# Patient Record
Sex: Female | Born: 1990 | Race: Black or African American | Hispanic: No | Marital: Single | Smoking: Never smoker
Health system: Southern US, Community
[De-identification: ages and names within clinical notes are randomized; demographics above are authoritative.]

## PROBLEM LIST (undated history)

## (undated) ENCOUNTER — Inpatient Hospital Stay (HOSPITAL_COMMUNITY): Payer: Self-pay

## (undated) DIAGNOSIS — Z789 Other specified health status: Secondary | ICD-10-CM

## (undated) HISTORY — PX: NO PAST SURGERIES: SHX2092

---
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
## 2010-07-20 IMAGING — CR DG TOE 4TH 2+V*L*
3 series · 3 of 3 positions shown · non-contrast
Comparison: None

CLINICAL DATA: Dislocated fourth toe on 12/16/2008 with continued
pain

LEFT TOE - 2+ VIEW

[t toes ap left]
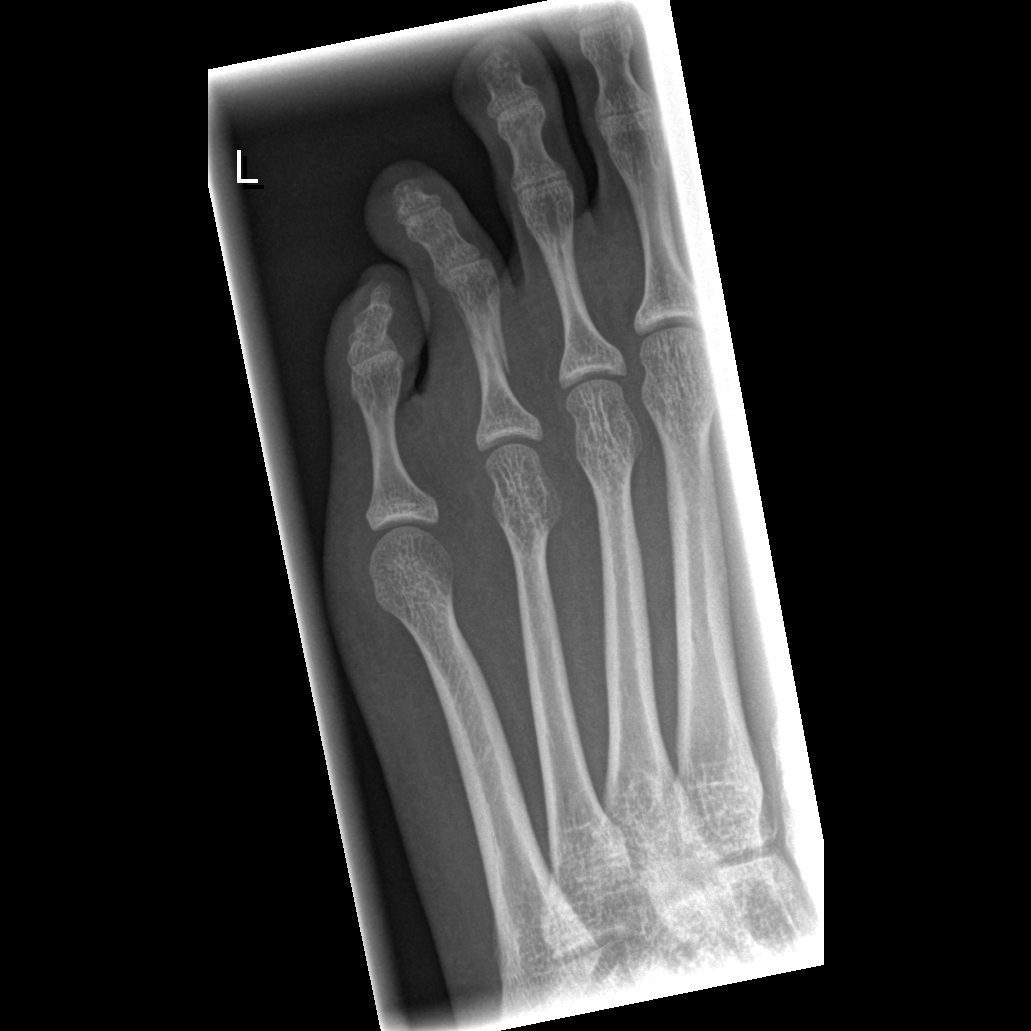

[t toes oblique left]
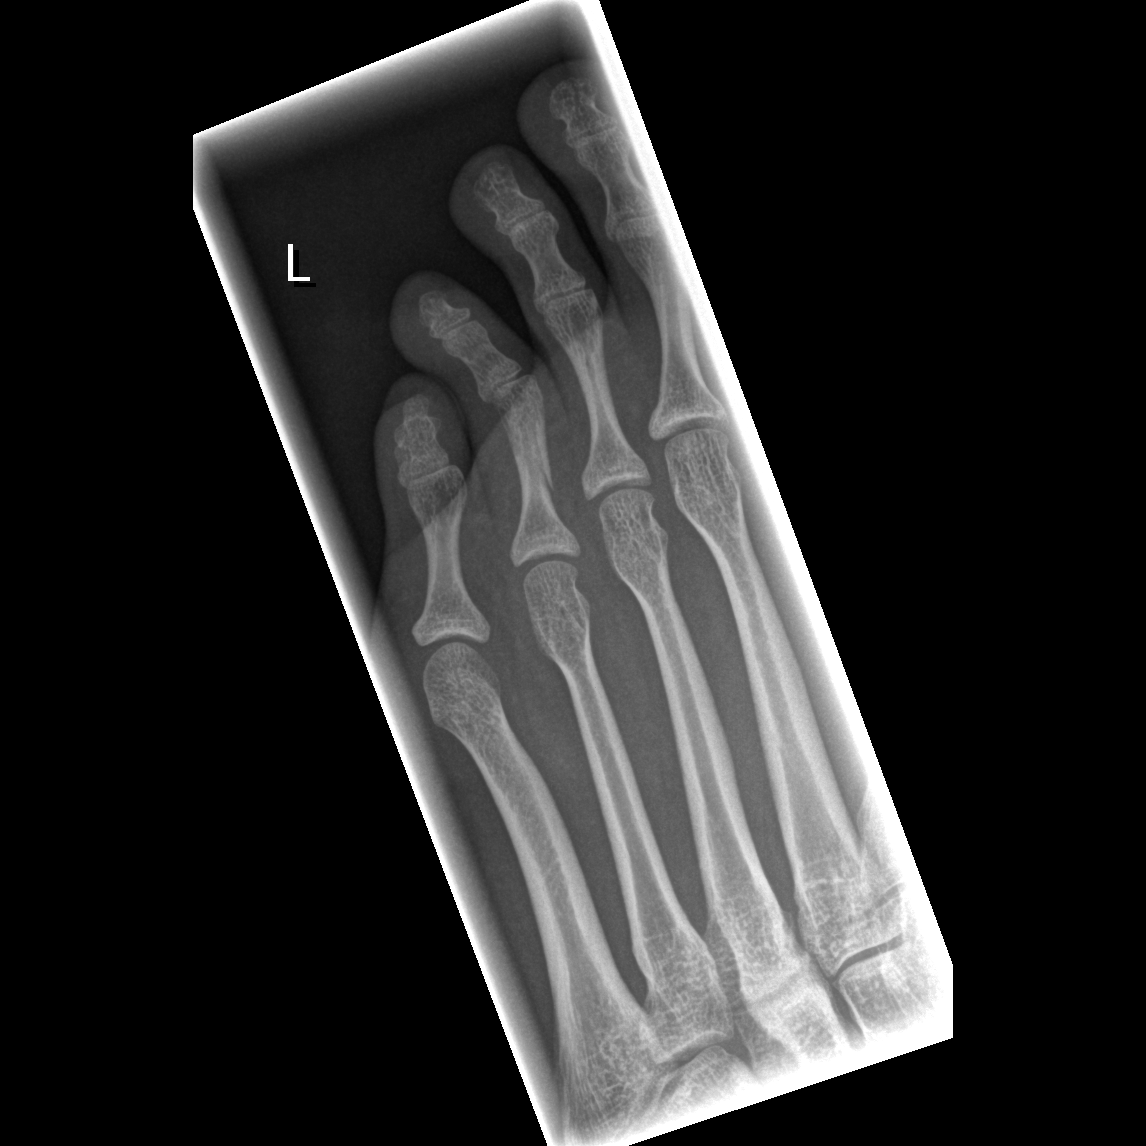

[t toes lateral left]
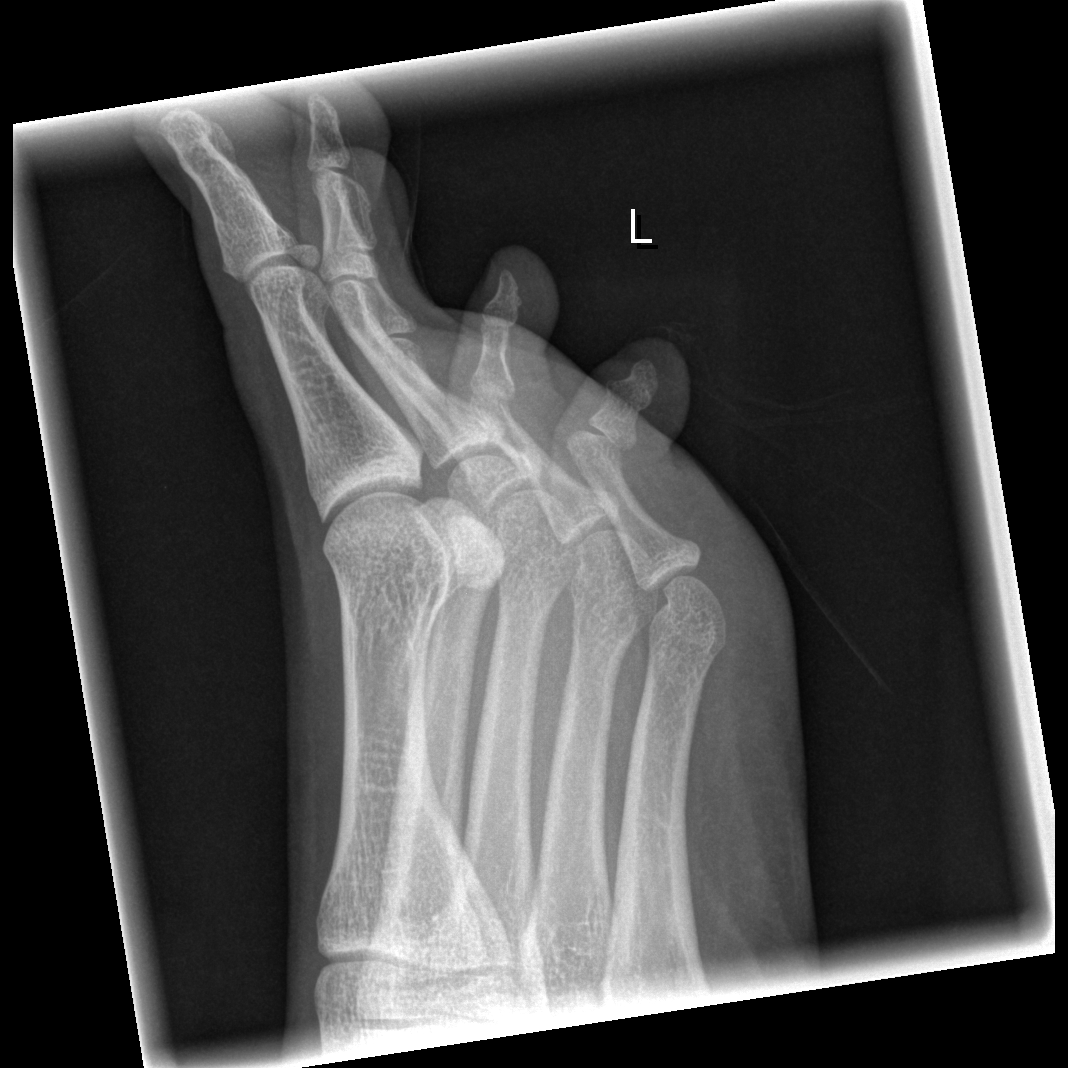

[3 of 3 positions shown; findings below may reference images not displayed]

FINDINGS: There is an oblique nondisplaced fracture of the
midportion of the proximal phalanx of the left fourth toe.  No
other abnormality is seen.  Joint spaces are normal.
IMPRESSION: Oblique nondisplaced fracture of the proximal phalanx of the left
fourth toe.

## 2014-02-02 ENCOUNTER — Encounter (HOSPITAL_COMMUNITY): Payer: Self-pay | Admitting: Emergency Medicine

## 2014-02-02 ENCOUNTER — Emergency Department (INDEPENDENT_AMBULATORY_CARE_PROVIDER_SITE_OTHER)
Admission: EM | Admit: 2014-02-02 | Discharge: 2014-02-02 | Disposition: A | Discharge status: Home / Self Care | Payer: 59 | Source: Home / Self Care | Attending: Family Medicine | Admitting: Family Medicine

## 2014-02-02 DIAGNOSIS — N390 Urinary tract infection, site not specified: Secondary | ICD-10-CM

## 2014-02-02 LAB — POCT URINALYSIS DIP (DEVICE)
BILIRUBIN URINE: NEGATIVE
GLUCOSE, UA: NEGATIVE mg/dL
Ketones, ur: NEGATIVE mg/dL
NITRITE: NEGATIVE
PH: 6.5 (ref 5.0–8.0)
Protein, ur: 100 mg/dL — AB
Specific Gravity, Urine: 1.025 (ref 1.005–1.030)
UROBILINOGEN UA: 0.2 mg/dL (ref 0.0–1.0)

## 2014-02-02 LAB — POCT PREGNANCY, URINE: Preg Test, Ur: NEGATIVE

## 2014-02-02 MED ORDER — CEPHALEXIN 500 MG PO CAPS: 500.0000 mg | ORAL_CAPSULE | Freq: Four times a day (QID) | ORAL | Status: DC

## 2014-02-02 NOTE — ED Notes (Signed)
C/o 2-3 day duration of pain w urinations, noted blood and clots in UA and on paper, none in underwear, none on tampon

## 2014-02-02 NOTE — Discharge Instructions (Signed)
Take all of medicine as directed, drink lots of fluids, see your doctor if further problems. °

## 2014-02-02 NOTE — ED Provider Notes (Signed)
CSN: [id number]     Arrival date & time 02/02/14  1029 History   First MD Initiated Contact with Patient 02/02/14 1114     Chief Complaint  Patient presents with  . Urinary Tract Infection   (Consider location/radiation/quality/duration/timing/severity/associated sxs/prior Treatment) Patient is a 23 y.o. female presenting with urinary tract infection. The history is provided by the patient.  Urinary Tract Infection This is a new problem. The current episode started more than 2 days ago. The problem has been gradually worsening. Associated symptoms include abdominal pain.    History reviewed. No pertinent past medical history. History reviewed. No pertinent past surgical history. History reviewed. No pertinent family history. History  Substance Use Topics  . Smoking status: Never Smoker   . Smokeless tobacco: Not on file  . Alcohol Use: No   OB History   Grav Para Term Preterm Abortions TAB SAB Ect Mult Living                 Review of Systems  Constitutional: Negative.   Gastrointestinal: Positive for abdominal pain.  Genitourinary: Positive for dysuria, urgency, frequency and hematuria. Negative for vaginal bleeding and vaginal discharge.    Allergies  Review of patient's allergies indicates no known allergies.  Home Medications   Prior to Admission medications   Medication Sig Start Date End Date Taking? Authorizing Provider  cephALEXin (KEFLEX) 500 MG capsule Take 1 capsule (500 mg total) by mouth 4 (four) times daily. Take all of medicine and drink lots of fluids 02/02/14   Linna Hoff, MD   BP 101/68  Pulse 88  Temp(Src) 98.7 F (37.1 C) (Oral)  Resp 16  SpO2 100%  LMP 01/19/2014 Physical Exam  Nursing note and vitals reviewed. Constitutional: She is oriented to person, place, and time. She appears well-developed and well-nourished.  Abdominal: Soft. Bowel sounds are normal. She exhibits no distension and no mass. There is tenderness in the suprapubic area.  There is no rigidity, no rebound, no guarding, no tenderness at McBurney's point and negative Murphy's sign.  Neurological: She is alert and oriented to person, place, and time.  Skin: Skin is warm and dry.    ED Course  Procedures (including critical care time) Labs Review Labs Reviewed  POCT URINALYSIS DIP (DEVICE) - Abnormal; Notable for the following:    Hgb urine dipstick MODERATE (*)    Protein, ur 100 (*)    Leukocytes, UA SMALL (*)    All other components within normal limits  POCT PREGNANCY, URINE    Imaging Review No results found.   MDM   1. UTI (lower urinary tract infection)        Linna Hoff, MD 02/02/14 1140

## 2014-02-02 NOTE — ED Notes (Signed)
Call back number for lab issues verified at release  

## 2014-12-26 ENCOUNTER — Ambulatory Visit: Payer: 59 | Admitting: Women's Health

## 2015-02-08 LAB — OB RESULTS CONSOLE GC/CHLAMYDIA
Chlamydia: NEGATIVE
Gonorrhea: NEGATIVE

## 2015-02-08 LAB — OB RESULTS CONSOLE RPR: RPR: NONREACTIVE

## 2015-02-08 LAB — OB RESULTS CONSOLE ABO/RH: RH TYPE: POSITIVE

## 2015-02-08 LAB — OB RESULTS CONSOLE RUBELLA ANTIBODY, IGM: RUBELLA: IMMUNE

## 2015-02-08 LAB — OB RESULTS CONSOLE HIV ANTIBODY (ROUTINE TESTING): HIV: NONREACTIVE

## 2015-02-08 LAB — OB RESULTS CONSOLE HEPATITIS B SURFACE ANTIGEN: Hepatitis B Surface Ag: NEGATIVE

## 2015-02-08 LAB — OB RESULTS CONSOLE ANTIBODY SCREEN: Antibody Screen: NEGATIVE

## 2015-07-27 ENCOUNTER — Encounter (HOSPITAL_COMMUNITY): Payer: Self-pay | Admitting: *Deleted

## 2015-07-27 ENCOUNTER — Inpatient Hospital Stay (HOSPITAL_COMMUNITY)
Admission: AD | Admit: 2015-07-27 | Discharge: 2015-07-27 | Disposition: A | Discharge status: Home / Self Care | Payer: 59 | Source: Ambulatory Visit | Attending: Obstetrics and Gynecology | Admitting: Obstetrics and Gynecology

## 2015-07-27 DIAGNOSIS — Z3A32 32 weeks gestation of pregnancy: Secondary | ICD-10-CM | POA: Insufficient documentation

## 2015-07-27 DIAGNOSIS — O2343 Unspecified infection of urinary tract in pregnancy, third trimester: Secondary | ICD-10-CM | POA: Diagnosis present

## 2015-07-27 DIAGNOSIS — O479 False labor, unspecified: Secondary | ICD-10-CM

## 2015-07-27 DIAGNOSIS — O4703 False labor before 37 completed weeks of gestation, third trimester: Secondary | ICD-10-CM | POA: Diagnosis not present

## 2015-07-27 HISTORY — DX: Other specified health status: Z78.9

## 2015-07-27 LAB — WET PREP, GENITAL
Clue Cells Wet Prep HPF POC: NONE SEEN
SPERM: NONE SEEN
Trich, Wet Prep: NONE SEEN
Yeast Wet Prep HPF POC: NONE SEEN

## 2015-07-27 LAB — URINALYSIS, ROUTINE W REFLEX MICROSCOPIC
BILIRUBIN URINE: NEGATIVE
Glucose, UA: 250 mg/dL — AB
Hgb urine dipstick: NEGATIVE
Ketones, ur: NEGATIVE mg/dL
NITRITE: NEGATIVE
Protein, ur: NEGATIVE mg/dL
SPECIFIC GRAVITY, URINE: 1.025 (ref 1.005–1.030)
pH: 6 (ref 5.0–8.0)

## 2015-07-27 LAB — URINE MICROSCOPIC-ADD ON

## 2015-07-27 MED ORDER — NIFEDIPINE 10 MG PO CAPS
10.0000 mg | ORAL_CAPSULE | Freq: Once | ORAL | Status: AC
  Administered 2015-07-27: 10 mg via ORAL
  Filled 2015-07-27: qty 1

## 2015-07-27 MED ORDER — LACTATED RINGERS IV BOLUS (SEPSIS)
1000.0000 mL | Freq: Once | INTRAVENOUS | Status: DC
Start: 2015-07-27 — End: 2015-07-27

## 2015-07-27 MED ORDER — CEPHALEXIN 500 MG PO CAPS: 500.0000 mg | ORAL_CAPSULE | Freq: Four times a day (QID) | ORAL | Status: DC

## 2015-07-27 NOTE — MAU Note (Signed)
Dr. Richardson Doppole states to hold fluids until further notice since pt is working on drinking her second pitcher of water.

## 2015-07-27 NOTE — Discharge Instructions (Signed)
Braxton Hicks Contractions °Contractions of the uterus can occur throughout pregnancy. Contractions are not always a sign that you are in labor.  °WHAT ARE BRAXTON HICKS CONTRACTIONS?  °Contractions that occur before labor are called Braxton Hicks contractions, or false labor. Toward the end of pregnancy (32-34 weeks), these contractions can develop more often and may become more forceful. This is not true labor because these contractions do not result in opening (dilatation) and thinning of the cervix. They are sometimes difficult to tell apart from true labor because these contractions can be forceful and people have different pain tolerances. You should not feel embarrassed if you go to the hospital with false labor. Sometimes, the only way to tell if you are in true labor is for your health care provider to look for changes in the cervix. °If there are no prenatal problems or other health problems associated with the pregnancy, it is completely safe to be sent home with false labor and await the onset of true labor. °HOW CAN YOU TELL THE DIFFERENCE BETWEEN TRUE AND FALSE LABOR? °False Labor °· The contractions of false labor are usually shorter and not as hard as those of true labor.   °· The contractions are usually irregular.   °· The contractions are often felt in the front of the lower abdomen and in the groin.   °· The contractions may go away when you walk around or change positions while lying down.   °· The contractions get weaker and are shorter lasting as time goes on.   °· The contractions do not usually become progressively stronger, regular, and closer together as with true labor.   °True Labor °· Contractions in true labor last 30-70 seconds, become very regular, usually become more intense, and increase in frequency.   °· The contractions do not go away with walking.   °· The discomfort is usually felt in the top of the uterus and spreads to the lower abdomen and low back.   °· True labor can be  determined by your health care provider with an exam. This will show that the cervix is dilating and getting thinner.   °WHAT TO REMEMBER °· Keep up with your usual exercises and follow other instructions given by your health care provider.   °· Take medicines as directed by your health care provider.   °· Keep your regular prenatal appointments.   °· Eat and drink lightly if you think you are going into labor.   °· If Braxton Hicks contractions are making you uncomfortable:   °· Change your position from lying down or resting to walking, or from walking to resting.   °· Sit and rest in a tub of warm water.   °· Drink 2-3 glasses of water. Dehydration may cause these contractions.   °· Do slow and deep breathing several times an hour.   °WHEN SHOULD I SEEK IMMEDIATE MEDICAL CARE? °Seek immediate medical care if: °· Your contractions become stronger, more regular, and closer together.   °· You have fluid leaking or gushing from your vagina.   °· You have a fever.   °· You pass blood-tinged mucus.   °· You have vaginal bleeding.   °· You have continuous abdominal pain.   °· You have low back pain that you never had before.   °· You feel your baby's head pushing down and causing pelvic pressure.   °· Your baby is not moving as much as it used to.   °  °This information is not intended to replace advice given to you by your health care provider. Make sure you discuss any questions you have with your health care   provider. °  °Document Released: 05/11/2005 Document Revised: 05/16/2013 Document Reviewed: 02/20/2013 °Elsevier Interactive Patient Education ©2016 [employer] °Pregnancy and Urinary Tract Infection °A urinary tract infection (UTI) is a bacterial infection of the urinary tract. Infection of the urinary tract can include the ureters, kidneys (pyelonephritis), bladder (cystitis), and urethra (urethritis). All pregnant women should be screened for bacteria in the urinary tract. Identifying and treating a UTI will  decrease the risk of preterm labor and developing more serious infections in both the mother and baby. °CAUSES °Bacteria germs cause almost all UTIs.  °RISK FACTORS °Many factors can increase your chances of getting a UTI during pregnancy. These include: °· Having a short urethra. °· Poor toilet and hygiene habits. °· Sexual intercourse. °· Blockage of urine along the urinary tract. °· Problems with the pelvic muscles or nerves. °· Diabetes. °· Obesity. °· Bladder problems after having several children. °· Previous history of UTI. °SIGNS AND SYMPTOMS  °· Pain, burning, or a stinging feeling when urinating. °· Suddenly feeling the need to urinate right away (urgency). °· Loss of bladder control (urinary incontinence). °· Frequent urination, more than is common with pregnancy. °· Lower abdominal or back discomfort. °· Cloudy urine. °· Blood in the urine (hematuria). °· Fever.  °When the kidneys are infected, the symptoms may be: °· Back pain. °· Flank pain on the right side more so than the left. °· Fever. °· Chills. °· Nausea. °· Vomiting. °DIAGNOSIS  °A urinary tract infection is usually diagnosed through urine tests. Additional tests and procedures are sometimes done. These may include: °· Ultrasound exam of the kidneys, ureters, bladder, and urethra. °· Looking in the bladder with a lighted tube (cystoscopy). °TREATMENT °Typically, UTIs can be treated with antibiotic medicines.  °HOME CARE INSTRUCTIONS  °· Only take over-the-counter or prescription medicines as directed by your health care provider. If you were prescribed antibiotics, take them as directed. Finish them even if you start to feel better. °· Drink enough fluids to keep your urine clear or pale yellow. °· Do not have sexual intercourse until the infection is gone and your health care provider says it is okay. °· Make sure you are tested for UTIs throughout your pregnancy. These infections often come back.  °Preventing a UTI in the Future °· Practice  good toilet habits. Always wipe from front to back. Use the tissue only once. °· Do not hold your urine. Empty your bladder as soon as possible when the urge comes. °· Do not douche or use deodorant sprays. °· Wash with soap and warm water around the genital area and the anus. °· Empty your bladder before and after sexual intercourse. °· Wear underwear with a cotton crotch. °· Avoid caffeine and carbonated drinks. They can irritate the bladder. °· Drink cranberry juice or take cranberry pills. This may decrease the risk of getting a UTI. °· Do not drink alcohol. °· Keep all your appointments and tests as scheduled.  °SEEK MEDICAL CARE IF:  °· Your symptoms get worse. °· You are still having fevers 2 or more days after treatment begins. °· You have a rash. °· You feel that you are having problems with medicines prescribed. °· You have abnormal vaginal discharge. °SEEK IMMEDIATE MEDICAL CARE IF:  °· You have back or flank pain. °· You have chills. °· You have blood in your urine. °· You have nausea and vomiting. °· You have contractions of your uterus. °· You have a gush of fluid from the vagina. °MAKE SURE YOU: °· Understand   these instructions.   °· Will watch your condition.   °· Will get help right away if you are not doing well or get worse.   °  °This information is not intended to replace advice given to you by your health care provider. Make sure you discuss any questions you have with your health care provider. °  °Document Released: 09/05/2010 Document Revised: 03/01/2013 Document Reviewed: 12/08/2012 °Elsevier Interactive Patient Education ©2016 [employer] ° °

## 2015-07-27 NOTE — MAU Note (Signed)
Pt states when she used the bathroom this morning she saw some bleeding.  Pt denies clots.  Pt states there was some bleeding on the tissue and some mixed with the urine.  Pt denies any cramping.  Pt state she is feeling the baby move.

## 2015-07-27 NOTE — MAU Provider Note (Signed)
History     CSN: [id number]  Arrival date and time: 07/27/15 1024   First Provider Initiated Contact with Patient 07/27/15 1057      Chief Complaint  Patient presents with  . Vaginal Bleeding   HPI   [name] is a 25 y.o. female G1P0 at [id number] presenting to MAU with hematuria vs. Vaginal bleeding. The patient thinks the blood is coming from her urine. Symptoms started first thing this morning when she woke up. She describes it as very light pink that was noted on the toilet paper.   The last time she wiped she did not see any blood. Denies abdominal pain.  + fetal movement. Denies leaking of fluid.  Denies abdominal pain.   OB History    Gravida Para Term Preterm AB TAB SAB Ectopic Multiple Living   1               Past Medical History  Diagnosis Date  . Medical history non-contributory     Past Surgical History  Procedure Laterality Date  . No past surgeries      History reviewed. No pertinent family history.  Social History  Substance Use Topics  . Smoking status: Never Smoker   . Smokeless tobacco: None  . Alcohol Use: No    Allergies: No Known Allergies  Prescriptions prior to admission  Medication Sig Dispense Refill Last Dose  . Fe Fum-FePoly-Vit C-Vit B3 (INTEGRA) 62.5-62.5-40-3 MG CAPS Take 2 capsules by mouth daily.   07/26/2015 at Unknown time  . Prenatal Vit-Fe Fumarate-FA (MULTIVITAMIN-PRENATAL) 27-0.8 MG TABS tablet Take 1 tablet by mouth daily at 12 noon.    07/26/2015 at Unknown time  . cephALEXin (KEFLEX) 500 MG capsule Take 1 capsule (500 mg total) by mouth 4 (four) times daily. Take all of medicine and drink lots of fluids (Patient not taking: Reported on 07/27/2015) 20 capsule 0 Completed Course at Unknown time   Results for orders placed or performed during the hospital encounter of 07/27/15 (from the past 48 hour(s))  Urinalysis, Routine w reflex microscopic (not at [facility])     Status: Abnormal   Collection Time: 07/27/15 10:32 AM   Result Value Ref Range   Color, Urine YELLOW YELLOW   APPearance CLEAR CLEAR   Specific Gravity, Urine 1.025 1.005 - 1.030   pH 6.0 5.0 - 8.0   Glucose, UA 250 (A) NEGATIVE mg/dL   Hgb urine dipstick NEGATIVE NEGATIVE   Bilirubin Urine NEGATIVE NEGATIVE   Ketones, ur NEGATIVE NEGATIVE mg/dL   Protein, ur NEGATIVE NEGATIVE mg/dL   Nitrite NEGATIVE NEGATIVE   Leukocytes, UA TRACE (A) NEGATIVE  Urine microscopic-add on     Status: Abnormal   Collection Time: 07/27/15 10:32 AM  Result Value Ref Range   Squamous Epithelial / LPF TOO NUMEROUS TO COUNT (A) NONE SEEN   WBC, UA 6-30 0 - 5 WBC/hpf   RBC / HPF 0-5 0 - 5 RBC/hpf   Bacteria, UA FEW (A) NONE SEEN   Urine-Other MUCOUS PRESENT   Wet prep, genital     Status: Abnormal   Collection Time: 07/27/15 11:04 AM  Result Value Ref Range   Yeast Wet Prep HPF POC NONE SEEN NONE SEEN   Trich, Wet Prep NONE SEEN NONE SEEN   Clue Cells Wet Prep HPF POC NONE SEEN NONE SEEN   WBC, Wet Prep HPF POC FEW (A) NONE SEEN    Comment: MODERATE BACTERIA SEEN   Sperm NONE SEEN  Review of Systems  Constitutional: Negative for fever.  Gastrointestinal: Negative for abdominal pain.  Genitourinary: Positive for dysuria, urgency and frequency. Negative for flank pain.  Musculoskeletal: Negative for back pain.   Physical Exam   Blood pressure 125/63, pulse 102, temperature 98.3 F (36.8 C), temperature source Oral, resp. rate 18, last menstrual period 12/10/2014.  Physical Exam  Constitutional: She is oriented to person, place, and time. She appears well-developed and well-nourished. No distress.  GI: Soft. She exhibits no distension. There is no tenderness.  Genitourinary:  Speculum exam: Vagina - Small amount of creamy, white discharge, no odor. No blood noted Cervix - No contact bleeding, no active bleeding  Bimanual exam: Cervix closed Wet prep done Chaperone present for exam.  Musculoskeletal: Normal range of motion.  Neurological:  She is alert and oriented to person, place, and time.  Skin: Skin is warm. She is not diaphoretic.  Psychiatric: Her behavior is normal.    Fetal Tracing: Baseline: 140 bpm  Variability: Moderate  Accelerations: Decelerations: Variable decel noted @ 1120; fetal heart rate down to the 80's lasting for 35 secs with good recovery back to baseline. Patient turned onto her right side.  Toco: Irregular pattern with UI.   MAU Course  Procedures  None  MDM Urine culture pending  Spoke to Dr. Richardson Dopp @ 1115 Procardia 10 mg #1 dose   Dr. Richardson Dopp in MAU> Reviewed fetal tracing with Dr. Richardson Dopp @ 1215> Dr. Richardson Dopp to see patient in MAU.  Procardia #2 dose ordered  Patient to continue po hydration.   Contractions spaced out following 2 doses of PO procardia.  Patient continues to be without pain   Assessment and Plan   A:  1. UTI (urinary tract infection) during pregnancy, third trimester   2. Braxton Hick's contraction     P:  Discharge home in stable condition Preterm labor precautions Urine culture pending RX: Keflex Return to MAU if symptoms worsen Fetal kick counts Follow up with Dr. Richardson Dopp as scheduled or sooner as needed.   Duane Lope, NP 07/27/2015 2:15 PM

## 2015-08-05 ENCOUNTER — Institutional Professional Consult (permissible substitution): Payer: 59 | Admitting: Pediatrics

## 2015-08-06 ENCOUNTER — Ambulatory Visit (INDEPENDENT_AMBULATORY_CARE_PROVIDER_SITE_OTHER): Payer: Self-pay | Admitting: Pediatrics

## 2015-08-06 DIAGNOSIS — Z7681 Expectant parent(s) prebirth pediatrician visit: Secondary | ICD-10-CM

## 2015-08-06 DIAGNOSIS — Z3490 Encounter for supervision of normal pregnancy, unspecified, unspecified trimester: Secondary | ICD-10-CM | POA: Insufficient documentation

## 2015-08-06 NOTE — Progress Notes (Signed)
Prenatal counseling for impending newborn done-- Z76.81  

## 2015-08-19 LAB — OB RESULTS CONSOLE GBS: GBS: NEGATIVE

## 2015-09-11 ENCOUNTER — Encounter (HOSPITAL_COMMUNITY): Payer: Self-pay | Admitting: *Deleted

## 2015-09-11 ENCOUNTER — Telehealth (HOSPITAL_COMMUNITY): Payer: Self-pay | Admitting: *Deleted

## 2015-09-11 NOTE — Telephone Encounter (Signed)
Preadmission screen  

## 2015-09-14 ENCOUNTER — Inpatient Hospital Stay (HOSPITAL_COMMUNITY)
Admission: AD | Admit: 2015-09-14 | Discharge: 2015-09-14 | Disposition: A | Discharge status: Home / Self Care | Payer: 59 | Source: Ambulatory Visit | Attending: Obstetrics and Gynecology | Admitting: Obstetrics and Gynecology

## 2015-09-14 ENCOUNTER — Encounter (HOSPITAL_COMMUNITY): Payer: Self-pay | Admitting: *Deleted

## 2015-09-14 DIAGNOSIS — O479 False labor, unspecified: Secondary | ICD-10-CM | POA: Diagnosis present

## 2015-09-14 NOTE — Progress Notes (Signed)
Alphonzo Severanceachel Stall, CNM notified of patients lack of cervical change, Scant amount of bleeding.  Will watch baby and d/c home with labor precautions

## 2015-09-14 NOTE — Progress Notes (Signed)
Telephone call to Alphonzo Severanceachel Stall, CNM. Discussed pts presenting complaints, FHT reviewed, cervical exam reviewed.  Pt may ambulate x 1 to 2 hr,  Will evaluate after

## 2015-09-14 NOTE — MAU Provider Note (Signed)
Cynthia [name], Cynthia [name] is a 24yo, G1P0, at 39.5 wks presenting to MAU for contractions on and off since 10 am this morning that are getting stronger.   Also Complains of light  Bright red vaginal bleeding, when she wipes, present since intercourse last night.  Reports last cervical exam in the office on Tuesday was 1 cm.    History     Patient Active Problem List   Diagnosis Date Noted  . Pre-birth visit for expectant mother 08/06/2015    Chief Complaint  Patient presents with  . Contractions  . Vaginal Bleeding   HPI  OB History    Gravida Para Term Preterm AB TAB SAB Ectopic Multiple Living   1               Past Medical History  Diagnosis Date  . Medical history non-contributory     Past Surgical History  Procedure Laterality Date  . No past surgeries      Family History  Problem Relation Age of Onset  . Diabetes Mother   . Diabetes Father   . Heart attack Maternal Uncle     Social History  Substance Use Topics  . Smoking status: Never Smoker   . Smokeless tobacco: Never Used  . Alcohol Use: No    Allergies: No Known Allergies  Prescriptions prior to admission  Medication Sig Dispense Refill Last Dose  . Fe Fum-FePoly-Vit C-Vit B3 (INTEGRA) 62.5-62.5-40-3 MG CAPS Take 2 capsules by mouth daily.   09/13/2015 at Unknown time  . loratadine (CLARITIN) 10 MG tablet Take 10 mg by mouth daily.   Past Week at Unknown time  . Prenatal Vit-Fe Fumarate-FA (MULTIVITAMIN-PRENATAL) 27-0.8 MG TABS tablet Take 1 tablet by mouth daily at 12 noon.    09/13/2015 at Unknown time  . cephALEXin (KEFLEX) 500 MG capsule Take 1 capsule (500 mg total) by mouth 4 (four) times daily. (Patient not taking: Reported on 09/14/2015) 20 capsule 0     ROS Physical Exam   Blood pressure 134/89, pulse 93, temperature 98.1 F (36.7 C), temperature source Oral, resp. rate 18, height 5\' 3"  (1.6 m), weight 69.4 kg (153 lb), last menstrual period 12/10/2014.  Dilation: 2 Effacement (%): 80 Cervical  Position: Anterior Station: -1 Presentation: Vertex Exam by:: Vira BlancoShimica Robinson, RN  FHT: Cat 1 tracing, 120 bpm, +accels, no decels UC:  Irregular, every 8 min, palpate mild   Physical Exam  Constitutional: She is oriented to person, place, and time. She appears well-developed.  HENT:  Head: Normocephalic.  Eyes: Pupils are equal, round, and reactive to light.  Neck: Normal range of motion.  Cardiovascular: Normal rate and regular rhythm.   Respiratory: Effort normal.  GI: Soft.  Genitourinary: Uterus normal.  Musculoskeletal: Normal range of motion.  Neurological: She is alert and oriented to person, place, and time. She has normal reflexes.  Skin: Skin is warm and dry.  Psychiatric: She has a normal mood and affect. Her behavior is normal. Judgment and thought content normal.    ED Course  Assessment: IUP 39.5 wks Irregular Contraction No cervical change despite ambulating for 2 hrs Cat 1 FT   Plan: DC Home Labor precautions Return for evaluation for loF or if water breaks Return when contraction are 2-4 min  (Primip) Encouraged rest and hydration  Alphonzo Severanceachel Camilah Spillman CNM, MSN 09/14/2015 3:13 PM

## 2015-09-14 NOTE — MAU Note (Signed)
Had intercourse last night contractions on and off since 10 am. Noticed bright red blood

## 2015-09-14 NOTE — Discharge Instructions (Signed)
Braxton Hicks Contractions °Contractions of the uterus can occur throughout pregnancy. Contractions are not always a sign that you are in labor.  °WHAT ARE BRAXTON HICKS CONTRACTIONS?  °Contractions that occur before labor are called Braxton Hicks contractions, or false labor. Toward the end of pregnancy (32-34 weeks), these contractions can develop more often and may become more forceful. This is not true labor because these contractions do not result in opening (dilatation) and thinning of the cervix. They are sometimes difficult to tell apart from true labor because these contractions can be forceful and people have different pain tolerances. You should not feel embarrassed if you go to the hospital with false labor. Sometimes, the only way to tell if you are in true labor is for your health care provider to look for changes in the cervix. °If there are no prenatal problems or other health problems associated with the pregnancy, it is completely safe to be sent home with false labor and await the onset of true labor. °HOW CAN YOU TELL THE DIFFERENCE BETWEEN TRUE AND FALSE LABOR? °False Labor °· The contractions of false labor are usually shorter and not as hard as those of true labor.   °· The contractions are usually irregular.   °· The contractions are often felt in the front of the lower abdomen and in the groin.   °· The contractions may go away when you walk around or change positions while lying down.   °· The contractions get weaker and are shorter lasting as time goes on.   °· The contractions do not usually become progressively stronger, regular, and closer together as with true labor.   °True Labor °· Contractions in true labor last 30-70 seconds, become very regular, usually become more intense, and increase in frequency.   °· The contractions do not go away with walking.   °· The discomfort is usually felt in the top of the uterus and spreads to the lower abdomen and low back.   °· True labor can be  determined by your health care provider with an exam. This will show that the cervix is dilating and getting thinner.   °WHAT TO REMEMBER °· Keep up with your usual exercises and follow other instructions given by your health care provider.   °· Take medicines as directed by your health care provider.   °· Keep your regular prenatal appointments.   °· Eat and drink lightly if you think you are going into labor.   °· If Braxton Hicks contractions are making you uncomfortable:   °¨ Change your position from lying down or resting to walking, or from walking to resting.   °¨ Sit and rest in a tub of warm water.   °¨ Drink 2-3 glasses of water. Dehydration may cause these contractions.   °¨ Do slow and deep breathing several times an hour.   °WHEN SHOULD I SEEK IMMEDIATE MEDICAL CARE? °Seek immediate medical care if: °· Your contractions become stronger, more regular, and closer together.   °· You have fluid leaking or gushing from your vagina.   °· You have a fever.   °· You pass blood-tinged mucus.   °· You have vaginal bleeding.   °· You have continuous abdominal pain.   °· You have low back pain that you never had before.   °· You feel your baby's head pushing down and causing pelvic pressure.   °· Your baby is not moving as much as it used to.   °  °This information is not intended to replace advice given to you by your health care provider. Make sure you discuss any questions you have with your health care   provider. °  °Document Released: 05/11/2005 Document Revised: 05/16/2013 Document Reviewed: 02/20/2013 °Elsevier Interactive Patient Education ©2016 [employer] ° °

## 2015-09-15 ENCOUNTER — Telehealth: Payer: Self-pay

## 2015-09-15 ENCOUNTER — Inpatient Hospital Stay (HOSPITAL_COMMUNITY)
Admission: AD | Admit: 2015-09-15 | Discharge: 2015-09-18 | DRG: 766 | Disposition: A | Discharge status: Home / Self Care | Payer: 59 | Source: Ambulatory Visit | Attending: Obstetrics and Gynecology | Admitting: Obstetrics and Gynecology

## 2015-09-15 ENCOUNTER — Inpatient Hospital Stay (HOSPITAL_COMMUNITY): Payer: 59 | Admitting: Anesthesiology

## 2015-09-15 ENCOUNTER — Encounter (HOSPITAL_COMMUNITY)
Admission: AD | Disposition: A | Discharge status: Home / Self Care | Payer: Self-pay | Source: Ambulatory Visit | Attending: Obstetrics and Gynecology

## 2015-09-15 ENCOUNTER — Encounter (HOSPITAL_COMMUNITY): Payer: Self-pay

## 2015-09-15 DIAGNOSIS — Z8249 Family history of ischemic heart disease and other diseases of the circulatory system: Secondary | ICD-10-CM | POA: Diagnosis not present

## 2015-09-15 DIAGNOSIS — Z833 Family history of diabetes mellitus: Secondary | ICD-10-CM | POA: Diagnosis not present

## 2015-09-15 DIAGNOSIS — Z3A39 39 weeks gestation of pregnancy: Secondary | ICD-10-CM | POA: Diagnosis not present

## 2015-09-15 DIAGNOSIS — O479 False labor, unspecified: Secondary | ICD-10-CM

## 2015-09-15 DIAGNOSIS — Z3490 Encounter for supervision of normal pregnancy, unspecified, unspecified trimester: Secondary | ICD-10-CM

## 2015-09-15 LAB — COMPREHENSIVE METABOLIC PANEL
ALBUMIN: 2.4 g/dL — AB (ref 3.5–5.0)
ALT: 16 U/L (ref 14–54)
ANION GAP: 6 (ref 5–15)
AST: 33 U/L (ref 15–41)
Alkaline Phosphatase: 133 U/L — ABNORMAL HIGH (ref 38–126)
BILIRUBIN TOTAL: 0.3 mg/dL (ref 0.3–1.2)
BUN: 10 mg/dL (ref 6–20)
CHLORIDE: 101 mmol/L (ref 101–111)
CO2: 20 mmol/L — ABNORMAL LOW (ref 22–32)
Calcium: 8.1 mg/dL — ABNORMAL LOW (ref 8.9–10.3)
Creatinine, Ser: 0.79 mg/dL (ref 0.44–1.00)
GFR calc Af Amer: >60 mL/min (ref >60)
GFR calc non Af Amer: >60 mL/min (ref >60)
GLUCOSE: 116 mg/dL — AB (ref 65–99)
POTASSIUM: 3.6 mmol/L (ref 3.5–5.1)
SODIUM: 127 mmol/L — AB (ref 135–145)
TOTAL PROTEIN: 5.4 g/dL — AB (ref 6.5–8.1)

## 2015-09-15 LAB — CBC
HCT: 31.7 % — ABNORMAL LOW (ref 36.0–46.0)
HCT: 28.1 % — ABNORMAL LOW (ref 36.0–46.0)
HEMOGLOBIN: 10.7 g/dL — AB (ref 12.0–15.0)
Hemoglobin: 9.6 g/dL — ABNORMAL LOW (ref 12.0–15.0)
MCH: 28 pg (ref 26.0–34.0)
MCH: 28.4 pg (ref 26.0–34.0)
MCHC: 33.8 g/dL (ref 30.0–36.0)
MCHC: 34.2 g/dL (ref 30.0–36.0)
MCV: 83 fL (ref 78.0–100.0)
MCV: 83.1 fL (ref 78.0–100.0)
PLATELETS: 121 10*3/uL — AB (ref 150–400)
Platelets: 148 10*3/uL — ABNORMAL LOW (ref 150–400)
RBC: 3.82 MIL/uL — ABNORMAL LOW (ref 3.87–5.11)
RBC: 3.38 MIL/uL — ABNORMAL LOW (ref 3.87–5.11)
RDW: 14.4 % (ref 11.5–15.5)
RDW: 14.4 % (ref 11.5–15.5)
WBC: 13.3 10*3/uL — ABNORMAL HIGH (ref 4.0–10.5)
WBC: 12 10*3/uL — ABNORMAL HIGH (ref 4.0–10.5)

## 2015-09-15 LAB — PROTEIN / CREATININE RATIO, URINE
Creatinine, Urine: 78 mg/dL
PROTEIN CREATININE RATIO: 0.17 mg/mg{creat} — AB (ref 0.00–0.15)
TOTAL PROTEIN, URINE: 13 mg/dL

## 2015-09-15 LAB — TYPE AND SCREEN
ABO/RH(D): A POS
Antibody Screen: NEGATIVE

## 2015-09-15 LAB — RAPID HIV SCREEN (HIV 1/2 AB+AG)
HIV 1/2 ANTIBODIES: NONREACTIVE
HIV-1 P24 ANTIGEN - HIV24: NONREACTIVE

## 2015-09-15 LAB — ABO/RH: ABO/RH(D): A POS

## 2015-09-15 LAB — LACTATE DEHYDROGENASE: LDH: 168 U/L (ref 98–192)

## 2015-09-15 LAB — URIC ACID: Uric Acid, Serum: 4.9 mg/dL (ref 2.3–6.6)

## 2015-09-15 LAB — RPR: RPR Ser Ql: NONREACTIVE

## 2015-09-15 SURGERY — Surgical Case
Anesthesia: Regional | Site: Abdomen

## 2015-09-15 MED ORDER — BUPIVACAINE HCL (PF) 0.25 % IJ SOLN
INTRAMUSCULAR | Status: DC | PRN
  Administered 2015-09-15: 30 mL

## 2015-09-15 MED ORDER — LACTATED RINGERS IV SOLN: INTRAVENOUS | Status: DC

## 2015-09-15 MED ORDER — ONDANSETRON HCL 4 MG/2ML IJ SOLN: 4.0000 mg | Freq: Four times a day (QID) | INTRAMUSCULAR | Status: DC | PRN

## 2015-09-15 MED ORDER — EPHEDRINE 5 MG/ML INJ: 10.0000 mg | INTRAVENOUS | Status: DC | PRN

## 2015-09-15 MED ORDER — SCOPOLAMINE 1 MG/3DAYS TD PT72
MEDICATED_PATCH | TRANSDERMAL | Status: AC
  Filled 2015-09-15: qty 1

## 2015-09-15 MED ORDER — LIDOCAINE-EPINEPHRINE (PF) 2 %-1:200000 IJ SOLN
INTRAMUSCULAR | Status: DC | PRN
  Administered 2015-09-15: 3 mL via EPIDURAL
  Administered 2015-09-15: 7 mL via EPIDURAL
  Administered 2015-09-15: 2 mL via EPIDURAL
  Administered 2015-09-15: 5 mL via EPIDURAL

## 2015-09-15 MED ORDER — MORPHINE SULFATE (PF) 0.5 MG/ML IJ SOLN
INTRAMUSCULAR | Status: AC
  Filled 2015-09-15: qty 10

## 2015-09-15 MED ORDER — CEFAZOLIN SODIUM-DEXTROSE 2-4 GM/100ML-% IV SOLN
INTRAVENOUS | Status: AC
  Filled 2015-09-15: qty 100

## 2015-09-15 MED ORDER — BUPIVACAINE HCL (PF) 0.25 % IJ SOLN
INTRAMUSCULAR | Status: AC
  Filled 2015-09-15: qty 30

## 2015-09-15 MED ORDER — LIDOCAINE-EPINEPHRINE (PF) 2 %-1:200000 IJ SOLN
INTRAMUSCULAR | Status: AC
  Filled 2015-09-15: qty 20

## 2015-09-15 MED ORDER — ONDANSETRON HCL 4 MG/2ML IJ SOLN
INTRAMUSCULAR | Status: AC
  Filled 2015-09-15: qty 2

## 2015-09-15 MED ORDER — OXYTOCIN 10 UNIT/ML IJ SOLN
1.0000 m[IU]/min | INTRAVENOUS | Status: DC
  Administered 2015-09-15: 1 m[IU]/min via INTRAVENOUS

## 2015-09-15 MED ORDER — PHENYLEPHRINE 40 MCG/ML (10ML) SYRINGE FOR IV PUSH (FOR BLOOD PRESSURE SUPPORT): 80.0000 ug | PREFILLED_SYRINGE | INTRAVENOUS | Status: DC | PRN

## 2015-09-15 MED ORDER — PHENYLEPHRINE 40 MCG/ML (10ML) SYRINGE FOR IV PUSH (FOR BLOOD PRESSURE SUPPORT)
80.0000 ug | PREFILLED_SYRINGE | INTRAVENOUS | Status: DC | PRN
  Filled 2015-09-15: qty 20

## 2015-09-15 MED ORDER — OXYTOCIN 10 UNIT/ML IJ SOLN
INTRAMUSCULAR | Status: AC
  Filled 2015-09-15: qty 1

## 2015-09-15 MED ORDER — LACTATED RINGERS IV SOLN
500.0000 mL | Freq: Once | INTRAVENOUS | Status: AC
  Administered 2015-09-15: 500 mL via INTRAVENOUS

## 2015-09-15 MED ORDER — ONDANSETRON HCL 4 MG/2ML IJ SOLN
INTRAMUSCULAR | Status: DC | PRN
  Administered 2015-09-15: 4 mg via INTRAVENOUS

## 2015-09-15 MED ORDER — ACETAMINOPHEN 325 MG PO TABS: 650.0000 mg | ORAL_TABLET | ORAL | Status: DC | PRN

## 2015-09-15 MED ORDER — SODIUM CHLORIDE 0.9 % IR SOLN
Status: DC | PRN
Start: 2015-09-15 — End: 2015-09-15
  Administered 2015-09-15: 1000 mL

## 2015-09-15 MED ORDER — DEXAMETHASONE SODIUM PHOSPHATE 10 MG/ML IJ SOLN
INTRAMUSCULAR | Status: DC | PRN
Start: 2015-09-15 — End: 2015-09-15
  Administered 2015-09-15: 10 mg via INTRAVENOUS

## 2015-09-15 MED ORDER — OXYTOCIN BOLUS FROM INFUSION: 500.0000 mL | INTRAVENOUS | Status: DC

## 2015-09-15 MED ORDER — MORPHINE SULFATE (PF) 0.5 MG/ML IJ SOLN
INTRAMUSCULAR | Status: DC | PRN
  Administered 2015-09-15: 4 mg via EPIDURAL

## 2015-09-15 MED ORDER — DIPHENHYDRAMINE HCL 50 MG/ML IJ SOLN: 12.5000 mg | INTRAMUSCULAR | Status: DC | PRN

## 2015-09-15 MED ORDER — LACTATED RINGERS IV SOLN
500.0000 mL | INTRAVENOUS | Status: DC | PRN
  Administered 2015-09-15: 350 mL via INTRAVENOUS

## 2015-09-15 MED ORDER — CEFAZOLIN SODIUM-DEXTROSE 2-3 GM-% IV SOLR
INTRAVENOUS | Status: DC | PRN
  Administered 2015-09-15: 2 g via INTRAVENOUS

## 2015-09-15 MED ORDER — LIDOCAINE HCL (PF) 1 % IJ SOLN
INTRAMUSCULAR | Status: DC | PRN
  Administered 2015-09-15 (×2): 5 mL via EPIDURAL

## 2015-09-15 MED ORDER — LACTATED RINGERS IV SOLN
INTRAVENOUS | Status: DC
  Administered 2015-09-15: 125 mL/h via INTRAVENOUS
  Administered 2015-09-15 (×2): via INTRAVENOUS

## 2015-09-15 MED ORDER — FENTANYL CITRATE (PF) 100 MCG/2ML IJ SOLN
INTRAMUSCULAR | Status: AC
  Filled 2015-09-15: qty 2

## 2015-09-15 MED ORDER — LIDOCAINE HCL (PF) 1 % IJ SOLN
30.0000 mL | INTRAMUSCULAR | Status: DC | PRN
  Filled 2015-09-15: qty 30

## 2015-09-15 MED ORDER — FENTANYL CITRATE (PF) 100 MCG/2ML IJ SOLN
50.0000 ug | INTRAMUSCULAR | Status: DC | PRN
  Administered 2015-09-15 (×2): 100 ug via INTRAVENOUS
  Filled 2015-09-15 (×2): qty 2

## 2015-09-15 MED ORDER — LACTATED RINGERS IV SOLN
INTRAVENOUS | Status: DC | PRN
  Administered 2015-09-15 (×3): via INTRAVENOUS

## 2015-09-15 MED ORDER — SCOPOLAMINE 1 MG/3DAYS TD PT72
MEDICATED_PATCH | TRANSDERMAL | Status: DC | PRN
  Administered 2015-09-15: 1 via TRANSDERMAL

## 2015-09-15 MED ORDER — CITRIC ACID-SODIUM CITRATE 334-500 MG/5ML PO SOLN
30.0000 mL | ORAL | Status: DC | PRN
  Administered 2015-09-15: 30 mL via ORAL
  Filled 2015-09-15: qty 15

## 2015-09-15 MED ORDER — TERBUTALINE SULFATE 1 MG/ML IJ SOLN: 0.2500 mg | Freq: Once | INTRAMUSCULAR | Status: DC | PRN

## 2015-09-15 MED ORDER — OXYTOCIN 10 UNIT/ML IJ SOLN
INTRAMUSCULAR | Status: AC
Start: 2015-09-15 — End: 2015-09-15
  Filled 2015-09-15: qty 1

## 2015-09-15 MED ORDER — LACTATED RINGERS IV SOLN
500.0000 mL | Freq: Once | INTRAVENOUS | Status: DC
Start: 2015-09-15 — End: 2015-09-16

## 2015-09-15 MED ORDER — OXYTOCIN 10 UNIT/ML IJ SOLN
2.5000 [IU]/h | INTRAVENOUS | Status: DC
  Filled 2015-09-15: qty 4

## 2015-09-15 MED ORDER — FENTANYL CITRATE (PF) 100 MCG/2ML IJ SOLN
INTRAMUSCULAR | Status: DC | PRN
  Administered 2015-09-15: 100 ug via EPIDURAL

## 2015-09-15 MED ORDER — FENTANYL 2.5 MCG/ML BUPIVACAINE 1/10 % EPIDURAL INFUSION (WH - ANES)
14.0000 mL/h | INTRAMUSCULAR | Status: DC | PRN
  Administered 2015-09-15: 14 mL/h via EPIDURAL
  Administered 2015-09-15: 12 mL/h via EPIDURAL
  Filled 2015-09-15 (×2): qty 125

## 2015-09-15 MED ORDER — OXYTOCIN 10 UNIT/ML IJ SOLN
40.0000 [IU] | INTRAVENOUS | Status: DC | PRN
  Administered 2015-09-15: 40 [IU] via INTRAVENOUS

## 2015-09-15 MED ORDER — FENTANYL CITRATE (PF) 100 MCG/2ML IJ SOLN
25.0000 ug | INTRAMUSCULAR | Status: DC | PRN
  Administered 2015-09-15: 50 ug via INTRAVENOUS

## 2015-09-15 SURGICAL SUPPLY — 40 items
BENZOIN TINCTURE PRP APPL 2/3 (GAUZE/BANDAGES/DRESSINGS) ×3 IMPLANT
BOOTIES KNEE HIGH SLOAN (MISCELLANEOUS) ×6 IMPLANT
CHLORAPREP W/TINT 26ML (MISCELLANEOUS) ×3 IMPLANT
CLAMP CORD UMBIL (MISCELLANEOUS) IMPLANT
CLOSURE WOUND 1/2 X4 (GAUZE/BANDAGES/DRESSINGS) ×1
CLOTH BEACON ORANGE TIMEOUT ST (SAFETY) ×3 IMPLANT
DRAIN JACKSON PRT FLT 10 (DRAIN) IMPLANT
DRSG OPSITE POSTOP 4X10 (GAUZE/BANDAGES/DRESSINGS) ×3 IMPLANT
ELECT REM PT RETURN 9FT ADLT (ELECTROSURGICAL) ×3
ELECTRODE REM PT RTRN 9FT ADLT (ELECTROSURGICAL) ×1 IMPLANT
EVACUATOR SILICONE 100CC (DRAIN) IMPLANT
EXTRACTOR VACUUM M CUP 4 TUBE (SUCTIONS) IMPLANT
EXTRACTOR VACUUM M CUP 4' TUBE (SUCTIONS)
GLOVE BIOGEL PI IND STRL 7.0 (GLOVE) ×3 IMPLANT
GLOVE BIOGEL PI INDICATOR 7.0 (GLOVE) ×6
GLOVE ECLIPSE 6.5 STRL STRAW (GLOVE) ×3 IMPLANT
GOWN STRL REUS W/TWL LRG LVL3 (GOWN DISPOSABLE) ×9 IMPLANT
KIT ABG SYR 3ML LUER SLIP (SYRINGE) IMPLANT
NEEDLE HYPO 22GX1.5 SAFETY (NEEDLE) ×3 IMPLANT
NEEDLE HYPO 25X5/8 SAFETYGLIDE (NEEDLE) IMPLANT
NS IRRIG 1000ML POUR BTL (IV SOLUTION) ×6 IMPLANT
PACK C SECTION WH (CUSTOM PROCEDURE TRAY) ×3 IMPLANT
PAD OB MATERNITY 4.3X12.25 (PERSONAL CARE ITEMS) ×3 IMPLANT
PENCIL SMOKE EVAC W/HOLSTER (ELECTROSURGICAL) ×3 IMPLANT
RTRCTR C-SECT PINK 25CM LRG (MISCELLANEOUS) ×3 IMPLANT
SPONGE LAP 18X18 X RAY DECT (DISPOSABLE) ×9 IMPLANT
STRIP CLOSURE SKIN 1/2X4 (GAUZE/BANDAGES/DRESSINGS) ×2 IMPLANT
SUT MNCRL AB 3-0 PS2 27 (SUTURE) ×3 IMPLANT
SUT SILK 2 0 FSL 18 (SUTURE) IMPLANT
SUT VIC AB 0 CTX 36 (SUTURE) ×6
SUT VIC AB 0 CTX36XBRD ANBCTRL (SUTURE) ×3 IMPLANT
SUT VIC AB 1 CT1 36 (SUTURE) ×6 IMPLANT
SUT VIC AB 2-0 CT1 27 (SUTURE)
SUT VIC AB 2-0 CT1 TAPERPNT 27 (SUTURE) IMPLANT
SUT VIC AB 3-0 CT1 27 (SUTURE) ×2
SUT VIC AB 3-0 CT1 TAPERPNT 27 (SUTURE) ×1 IMPLANT
SUT VIC AB 3-0 SH 27 (SUTURE) ×2
SUT VIC AB 3-0 SH 27X BRD (SUTURE) ×1 IMPLANT
SYR 20CC LL (SYRINGE) ×3 IMPLANT
TRAY FOLEY CATH SILVER 14FR (SET/KITS/TRAYS/PACK) ×3 IMPLANT

## 2015-09-15 NOTE — Progress Notes (Signed)
Subjective: Comfortable, no complaints. Family at bedside.   Objective: BP 131/59 mmHg  Pulse 111  Temp(Src) 98.9 F (37.2 C) (Oral)  Resp 16  Ht 5\' 3"  (1.6 m)  Wt 69.4 kg (153 lb)  BMI 27.11 kg/m2  LMP 12/10/2014   Total I/O In: 200 [Other:200] Out: -   Filed Vitals:   09/15/15 1401 09/15/15 1431 09/15/15 1501 09/15/15 1532  BP: 130/88 115/66 105/65 131/59  Pulse: 107 105 103 111  Temp:   98.9 F (37.2 C)   TempSrc:   Oral   Resp:   16   Height:      Weight:       Results for orders placed or performed during the hospital encounter of 09/15/15 (from the past 24 hour(s))  CBC     Status: Abnormal   Collection Time: 09/15/15  6:45 AM  Result Value Ref Range   WBC 13.3 (H) 4.0 - 10.5 K/uL   RBC 3.82 (L) 3.87 - 5.11 MIL/uL   Hemoglobin 10.7 (L) 12.0 - 15.0 g/dL   HCT 65.731.7 (L) 84.636.0 - 96.246.0 %   MCV 83.0 78.0 - 100.0 fL   MCH 28.0 26.0 - 34.0 pg   MCHC 33.8 30.0 - 36.0 g/dL   RDW 95.214.4 84.111.5 - 32.415.5 %   Platelets 148 (L) 150 - 400 K/uL  Type and screen [facility]     Status: None   Collection Time: 09/15/15  6:45 AM  Result Value Ref Range   ABO/RH(D) A POS    Antibody Screen NEG    Sample Expiration 09/18/2015   Rapid HIV screen (HIV 1/2 Ab+Ag) ([facility] Only)     Status: None   Collection Time: 09/15/15  6:45 AM  Result Value Ref Range   HIV-1 P24 Antigen - HIV24 NON REACTIVE NON REACTIVE   HIV 1/2 Antibodies NON REACTIVE NON REACTIVE   Interpretation (HIV Ag Ab)      A non reactive test result means that HIV 1 or HIV 2 antibodies and HIV 1 p24 antigen were not detected in the specimen.  CBC     Status: Abnormal   Collection Time: 09/15/15  2:39 PM  Result Value Ref Range   WBC 12.0 (H) 4.0 - 10.5 K/uL   RBC 3.38 (L) 3.87 - 5.11 MIL/uL   Hemoglobin 9.6 (L) 12.0 - 15.0 g/dL   HCT 40.128.1 (L) 02.736.0 - 25.346.0 %   MCV 83.1 78.0 - 100.0 fL   MCH 28.4 26.0 - 34.0 pg   MCHC 34.2 30.0 - 36.0 g/dL   RDW 66.414.4 40.311.5 - 47.415.5 %   Platelets 121 (L) 150 - 400 K/uL   Comprehensive metabolic panel     Status: Abnormal   Collection Time: 09/15/15  2:39 PM  Result Value Ref Range   Sodium 127 (L) 135 - 145 mmol/L   Potassium 3.6 3.5 - 5.1 mmol/L   Chloride 101 101 - 111 mmol/L   CO2 20 (L) 22 - 32 mmol/L   Glucose, Bld 116 (H) 65 - 99 mg/dL   BUN 10 6 - 20 mg/dL   Creatinine, Ser 2.590.79 0.44 - 1.00 mg/dL   Calcium 8.1 (L) 8.9 - 10.3 mg/dL   Total Protein 5.4 (L) 6.5 - 8.1 g/dL   Albumin 2.4 (L) 3.5 - 5.0 g/dL   AST 33 15 - 41 U/L   ALT 16 14 - 54 U/L   Alkaline Phosphatase 133 (H) 38 - 126 U/L   Total Bilirubin 0.3  0.3 - 1.2 mg/dL   GFR calc non Af Amer >60 >60 mL/min   GFR calc Af Amer >60 >60 mL/min   Anion gap 6 5 - 15  Lactate dehydrogenase     Status: None   Collection Time: 09/15/15  2:39 PM  Result Value Ref Range   LDH 168 98 - 192 U/L  Uric acid     Status: None   Collection Time: 09/15/15  2:39 PM  Result Value Ref Range   Uric Acid, Serum 4.9 2.3 - 6.6 mg/dL  Protein / creatinine ratio, urine     Status: Abnormal   Collection Time: 09/15/15  2:43 PM  Result Value Ref Range   Creatinine, Urine 78.00 mg/dL   Total Protein, Urine 13 mg/dL   Protein Creatinine Ratio 0.17 (H) 0.00 - 0.15 mg/mg[Cre]   FHT: Category 1, 145 bpm, moderate variablity, +accels, no decels UC:   regular, every 4 minutes  SVE:   Dilation: 9 Effacement (%): 90 Station: +1 Exam by:: Katrinka Blazing, RN/Odell, RN  / R.Charistopher Rumble, CNM Membranes: AROM at 1140 Internal monitors: IUPC placed without difficulty , MVUs 80-100  Pain management:  Epidural, placed at 0951 IV fentanyl x2 doses, Last at 0807  GBS prophylaxis: N/A    Assessment:  IUP at 39.6wks Transitional labor AROM x 4hrs Epidural Inadequate contractions strength Platelets 121, decreased from 148 Other PIH labs wnl ,  PCR 0.17  Cat 1 FT GBS Negative  Plan: -Discussed R/B of IUPC catheter, including ability to monitor strength and frequent of contractions, pt agreeable -Discussed R/B of  augmentation via Pitocin, including hyperstimulation ;  Pt agreeable to proceed  -Begin Pitocin augmentation, 1x1 mu/min, per protocol, until  Mvus 180-200  -Frequent position changes to facilitate fetal rotation and descent - Continue other management as ordered - Anticipate SVD - Dr. Su Hilt updated/consulted   Alphonzo Severance CNM, [place] 09/15/2015, 3:59 PM

## 2015-09-15 NOTE — H&P (Signed)
Cynthia [name] is a 25 y.o. female presenting for abdominal pain.  Patient is a G1P0 who is 39.6wks and under the care of T.Richardson Doppole, [facility] of [facility].  Patient was evaluated on 4/22 for contractions and states that they have since stopped, but she is now experiencing rectal pressure, abdominal, and back pain.  Patient states she has taken tylenol with no relief.  Patient advised to report to MAU at 0300, but declined stating that she didn't want to be sent back home.  Upon arrival and nurse exam, patient 8/90/-1 with BBOW.  Patient desires epidural and is GBS negative.  No significant pregnancy history of note.  Maternal Medical History:  Reason for admission: Contractions.   Contractions: Onset was 6-12 hours ago.   Frequency: regular.   Perceived severity is moderate.    Fetal activity: Perceived fetal activity is normal.   Last perceived fetal movement was within the past hour.    Prenatal complications: no prenatal complications Prenatal Complications - Diabetes: none.    OB History    Gravida Para Term Preterm AB TAB SAB Ectopic Multiple Living   1              Past Medical History  Diagnosis Date  . Medical history non-contributory    Past Surgical History  Procedure Laterality Date  . No past surgeries     Family History: family history includes Diabetes in her father and mother; Heart attack in her maternal uncle. Social History:  reports that she has never smoked. She has never used smokeless tobacco. She reports that she does not drink alcohol or use illicit drugs.   Prenatal Transfer Tool  Maternal Diabetes: No Genetic Screening: Normal Maternal Ultrasounds/Referrals: Normal Fetal Ultrasounds or other Referrals:  None Maternal Substance Abuse:  No Significant Maternal Medications:  None Significant Maternal Lab Results:  Lab values include: Group B Strep negative Other Comments:  None  ROS  Dilation: 8 Effacement (%): 90 Station: -1 Exam by:: Sharen Hintaroline  Brewer RNC Last menstrual period 12/10/2014. Maternal Exam:  Uterine Assessment: Contraction strength is moderate.  Contraction frequency is regular.   Abdomen: Fetal presentation: vertex  Introitus: not evaluated.   Amniotic fluid character: not assessed.  Pelvis: adequate for delivery.      Fetal Exam Fetal Monitor Review: Mode: ultrasound.   Baseline rate: 144.  Variability: absent.    Fetal State Assessment: Category I - tracings are normal.     Physical Exam  Constitutional: She is oriented to person, place, and time. She appears well-developed and well-nourished. No distress.  HENT:  Head: Normocephalic and atraumatic.  Eyes: Conjunctivae are normal.  Neck: Normal range of motion.  Cardiovascular: Normal rate.   Respiratory: Effort normal.  GI: Soft.  Musculoskeletal: Normal range of motion.  Neurological: She is alert and oriented to person, place, and time.  Skin: Skin is warm and dry.    Prenatal labs: ABO, Rh: A/Positive/-- (09/16 0000) Antibody: Negative (09/16 0000) Rubella: Immune (09/16 0000) RPR: Nonreactive (09/16 0000)  HBsAg: Negative (09/16 0000)  HIV: Non-reactive (09/16 0000)  GBS: Negative (03/27 0000)   Assessment/Plan: IUP at 39.6wks Active Labor GBS Negative  [facility]  Routine Labor and Delivery Orders per [facility] Protocol Okay for epidural as desired Expectant Mgmt Dr.SR to be updated as appropriate Report to be given to oncoming provider, R. Stall, CNM  Cherre RobinsJessica L Fread Kottke MSN, CNM 09/15/2015, 6:38 AM

## 2015-09-15 NOTE — Progress Notes (Signed)
Subjective: Comfortable with epidural, no complaints.  Family at bedside for support.   Objective: BP 133/85 mmHg  Pulse 107  Temp(Src) 98.4 F (36.9 C) (Oral)  Resp 14  Ht 5\' 3"  (1.6 m)  Wt 69.4 kg (153 lb)  BMI 27.11 kg/m2  LMP 12/10/2014   Total I/O In: 200 [Other:200] Out: -    Filed Vitals:   09/15/15 1200 09/15/15 1231 09/15/15 1301 09/15/15 1330  BP: 124/73 137/88 137/91 133/85  Pulse: 100 113 124 107  Temp:    98.4 F (36.9 C)  TempSrc:    Oral  Resp:  16  14  Height:      Weight:          FHT: Category  1, 150 bpm, moderate variability, +accels, no decels UC:   regular, every 2 minutes SVE:   Dilation: 8 Effacement (%): 90 Station: +1 Exam by:: Katrinka BlazingSmith, RN/Odell, RN  Membranes: AROM at 1140 Internal monitors: None  Pain management:  Epidural, placed at 0951 IV fentanyl x2 doses, Last at 0807  GBS prophylaxis: N/A   Assessment:  IUP at 39.6wks Active Labor AROM  Adequate contraction frequency Elevated BP  Cat 1 FT GBS Negative  Plan: -baseline PIH labs -Frequent position changes to facilitate fetal rotation and descent  -Expectant management - Anticipate SVD Su Hilt- Roberts, MD update  Alphonzo Severanceachel Jamieon Lannen CNM, [place] 09/15/2015, 2:21 PM

## 2015-09-15 NOTE — Anesthesia Procedure Notes (Signed)
Epidural Patient location during procedure: OB Start time: 09/15/2015 9:40 AM End time: 09/15/2015 9:45 AM  Staffing Anesthesiologist: Ronelle NighEWELL, Isacc Turney Performed by: anesthesiologist   Preanesthetic Checklist Completed: patient identified, site marked, surgical consent, pre-op evaluation, timeout performed, IV checked, risks and benefits discussed and monitors and equipment checked  Epidural Patient position: sitting Prep: site prepped and draped and DuraPrep Patient monitoring: continuous pulse ox and blood pressure Approach: midline Location: L3-L4 Injection technique: LOR saline  Needle:  Needle type: Tuohy  Needle gauge: 17 G Needle length: 9 cm and 9 Needle insertion depth: 5 cm cm Catheter type: closed end flexible Catheter size: 19 Gauge Catheter at skin depth: 10 cm Test dose: negative  Assessment Sensory level: T6 Events: blood not aspirated, injection not painful, no injection resistance, negative IV test and no paresthesia  Additional Notes Patient identified. Risks/Benefits/Options discussed with patient including but not limited to bleeding, infection, nerve damage, paralysis, failed block, incomplete pain control, headache, blood pressure changes, nausea, vomiting, reactions to medication both or allergic, itching and postpartum back pain. Confirmed with bedside nurse the patient's most recent platelet count. Confirmed with patient that they are not currently taking any anticoagulation, have any bleeding history or any family history of bleeding disorders. Patient expressed understanding and wished to proceed. All questions were answered. Sterile technique was used throughout the entire procedure. Please see nursing notes for vital signs. Test dose was given through epidural catheter and negative prior to continuing to dose epidural or start infusion. Warning signs of high block given to the patient including shortness of breath, tingling/numbness in hands, complete motor  block, or any concerning symptoms with instructions to call for help. Patient was given instructions on fall risk and not to get out of bed. All questions and concerns addressed with instructions to call with any issues or inadequate analgesia.

## 2015-09-15 NOTE — Anesthesia Postprocedure Evaluation (Signed)
Anesthesia Post Note  Patient: Oswaldo ConroyLatashra K Velaquez  Procedure(s) Performed: Procedure(s) (LRB): CESAREAN SECTION (N/A)  Patient location during evaluation: PACU Anesthesia Type: Epidural Level of consciousness: awake and alert Pain management: pain level controlled Vital Signs Assessment: post-procedure vital signs reviewed and stable Respiratory status: spontaneous breathing, nonlabored ventilation, respiratory function stable and patient connected to nasal cannula oxygen Cardiovascular status: blood pressure returned to baseline and stable Postop Assessment: no signs of nausea or vomiting Anesthetic complications: no    Last Vitals:  Filed Vitals:   09/15/15 2257 09/15/15 2300  BP:  130/84  Pulse: 114 113  Temp:    Resp: 19 15    Last Pain:  Filed Vitals:   09/15/15 2303  PainSc: 3                  Damek Ende L

## 2015-09-15 NOTE — Transfer of Care (Signed)
Immediate Anesthesia Transfer of Care Note  Patient: Cynthia [name]  Procedure(s) Performed: Procedure(s): CESAREAN SECTION (N/A)  Patient Location: PACU  Anesthesia Type:Epidural  Level of Consciousness: awake  Airway & Oxygen Therapy: Patient Spontanous Breathing  Post-op Assessment: Report given to RN and Post -op Vital signs reviewed and stable  Post vital signs: stable  Last Vitals:  Filed Vitals:   09/15/15 1931 09/15/15 1942  BP: 130/87   Pulse: 109   Temp:  37.3 C  Resp:      Complications: No apparent anesthesia complications

## 2015-09-15 NOTE — Progress Notes (Signed)
Subjective: Comfortable with epidural.   Objective: BP 115/71 mmHg  Pulse 93  Temp(Src) 98 F (36.7 C) (Oral)  Resp 16  Ht 5\' 3"  (1.6 m)  Wt 69.4 kg (153 lb)  BMI 27.11 kg/m2  LMP 12/10/2014   Total I/O In: 200 [Other:200] Out: -   FHT: Category 1, 145 bpm, moderate variability,  UC:   regular, every 3 minutes SVE:   Dilation: 8 Effacement (%): 90 Station: 0, +1 Exam by:: Smith/ODell Membranes: AROM at 1140, clear large amount Internal monitors: None  Pain management:  Epidural, placed at 0951 IV fentanyl x2 doses, Last at 0807  GBS prophylaxis: N/A   Assessment:  IUP at 39.6wks Active Labor AROM  Cervical Change  Adequate contractions Cat 1 FT GBS Negative  Plan: Frequent position changes to facilitate feta rotation and descent  --Expectant management - Anticipate SVD Su Hilt- Roberts, MD update  Alphonzo Severanceachel Wren Pryce CNM, [place] 09/15/2015, 11:49 AM

## 2015-09-15 NOTE — Telephone Encounter (Signed)
Patient call states she has been having contractions and was seen in the MAU, but discharged due to lack of dilation.  Pt now states that she is not having contractions, but is now having constant rectal pressure and back pain.  Patient states this has been ongoing since the contractions stopped at 11pm.  Patient endorses fetal movement and bloody show.  Patient denies LOF and contractions.  Patient reports taking tylenol at 2000 and 0100 as well as benadryl at 2100.  Patient reports that she had bowel movements throughout the day and last one was at 2030.  Patient reports being 10 minutes from the hospital and instructed to report to hospital for evaluation. However, patient expresses distaste regarding an evaluation with possible discharge.  Instructed to attempt hot shower or bath to help ease pain and hydrate.  Patient further encouraged to take sleep dose (50mg ) of benadryl.  Patient encouraged to report to hospital if pain worsens or is unrelieved with interventions. JE, CNM

## 2015-09-15 NOTE — Progress Notes (Signed)
Cynthia ConroyLatashra K [name] MRN: [id number]  Subjective: -Care assumed. Patient is a 24y.o. G1P0 who presented in active labor.  Patient has been between 8-9 cm since 1140 with minimal change.  FHT remained reassuring. In room to assess.  Patient reports abdominal pressure, but denies rectal or vaginal pressure.  Objective: BP 139/92 mmHg  Pulse 105  Temp(Src) 99.2 F (37.3 C) (Oral)  Resp 18  Ht 5\' 3"  (1.6 m)  Wt 69.4 kg (153 lb)  BMI 27.11 kg/m2  LMP 12/10/2014 I/O last 3 completed shifts: In: 200 [Other:200] Out: 500 [Urine:500]    Fetal Monitoring: FHT: 150 bpm, Mod Var, -Decels, -Accels UC: Q1-534min, palpates strong    Vaginal Exam: SVE:   Dilation: 8 Effacement (%): 90 Station: 0, +1 Exam by:: Sabas SousJ. Ermie Glendenning, CNM Membranes:AROM x 7hrs Internal Monitors: IUPC removed d/t non function  Augmentation/Induction: Pitocin:S/P Cytotec: None  Assessment:  IUP at 39.6wks Cat I FT  Failure to Progress Suspected Asynclitic Vertex GBS Negative Low Grade Fever  Plan: -Attempted to rotate vertex and promote cervical reducation with position change and light pushing-Unsuccessful -Recommendation made for primary c/s due to failure to progress secondary to fetal asynclitism  -Patient tearful and states "I guess."  Patient encouraged to discuss, with family, after discussion of r/b -R/B reviewed including, but not limited to, infection, bleeding, pain, damage to organs or fetus resulting in need for additional surgery.  Patient understands these risks and accepts blood products if necessary. -Patient encouraged to inform RN of decision -Dr. Lynford HumphreySR updated on recommendation and is en route to hospital -Staff notified of recommendation and is on standby  Valma CavaJessica L Kensy Blizard,MSN, CNM 09/15/2015, 8:14 PM

## 2015-09-15 NOTE — Progress Notes (Signed)
Subjective: Comfortable, S/p epidural. Sleeping  Objective: BP 137/91 mmHg  Pulse 98  Temp(Src) 98 F (36.7 C) (Oral)  Resp 16  Ht 5\' 3"  (1.6 m)  Wt 69.4 kg (153 lb)  BMI 27.11 kg/m2  LMP 12/10/2014     FHT: Category 1, 145 bpm, moderate variability, +accels, no decels UC:   regular, every 1-3 minutes SVE:   Dilation: 7.5 Effacement (%): 90 Station: 0 Exam by:: Smith/ODell Membranes: Intact, BBOW Internal monitors: None  Pain management:  IV pain medication:  Epidural, placed at 0951  IV fentanyl x2 doses, Last at 0807  GBS prophylaxis: N/A    Assessment:  IUP at 39.6wks Active Labor Cervical Change  Adequate contractions Membranes, Intact, BBOW Cat 1 FT GBS Negative  Plan: - Frequent position changes to facilitate feta rotation and descent  -May consider amniotomy at next check if SROM does not occur -Expectant management - anticipate SVD  Alphonzo Severanceachel Braysen Cloward CNM, [place] 09/15/2015, 10:30 AM

## 2015-09-15 NOTE — Progress Notes (Signed)
Subjective: Comfortable, feels occasional pressure with contractions.  Occasional bouts of nausea and vomiting.   Objective: BP 139/92 mmHg  Pulse 105  Temp(Src) 99.2 F (37.3 C) (Oral)  Resp 18  Ht 5\' 3"  (1.6 m)  Wt 69.4 kg (153 lb)  BMI 27.11 kg/m2  LMP 12/10/2014 I/O last 3 completed shifts: In: 200 [Other:200] Out: 500 [Urine:500]    FHT: Category 1, moderate variability, +accels, no decels UC:   irregular, every 1- 2 minutes SVE:   Dilation: 9 Effacement (%): 90 Station: +1, +2 Exam by:: Cynthia [name] Membranes: AROM at 1140 Internal monitors: IUPC - replaced but not working due to low station of fetal head.   RN called, left IUPC in place and external TOCO applied ... Contractions noted every 1-2 minutes  Augmentation: Pitocin was at 4 mu/min. Stopped at 1905 due to potential hyperstimulation  Pain management:  Epidural, placed at 0951 IV fentanyl x2 doses, Last at 0807  GBS prophylaxis: N/A    Assessment:  IUP at 39.6wks Transitional labor AROM x 8.4 hrs Epidural Inadequate contractions strength Platelets 121, decreased from 148 Other PIH labs wnl ,PCR 0.17 Cat 1 FT GBS Negative  Plan: -IUPC catheter not working properly, replaced and second catheter not functioning due to position of fetal head - External tocometry applied - Pitocin stopped due to contraction frequency 1-2 min -Frequent position changes to facilitate fetal rotation and descent - Continue other management as ordered - Anticipate SVD - Dr. Su Hiltoberts updated/consulted Report to oncoming provider, Cynthia [name], CNM   Cynthia [name] CNM, MSN 09/15/2015, 7:47 PM

## 2015-09-15 NOTE — Progress Notes (Signed)
Notified of pt arrival in MAU and vaginal exam. Will admit to labor and delivery

## 2015-09-15 NOTE — Progress Notes (Signed)
Subjective: Assumed care.  In to greet patient , FOB, and family. Uncomfortable with contractions, requesting more IV pain medication. Does not desire an epidural at this time.   Objective: BP 136/88 mmHg  Pulse 108  Temp(Src) 98.1 F (36.7 C) (Oral)  Resp 18  LMP 12/10/2014      Filed Vitals:   09/15/15 0702 09/15/15 0807  BP: 136/88   Pulse: 108   Temp: 98.1 F (36.7 C)   TempSrc: Oral   Resp: 18   Height:  5\' 3"  (1.6 m)  Weight:  69.4 kg (153 lb)   Results for orders placed or performed during the hospital encounter of 09/15/15 (from the past 24 hour(s))  CBC     Status: Abnormal   Collection Time: 09/15/15  6:45 AM  Result Value Ref Range   WBC 13.3 (H) 4.0 - 10.5 K/uL   RBC 3.82 (L) 3.87 - 5.11 MIL/uL   Hemoglobin 10.7 (L) 12.0 - 15.0 g/dL   HCT 45.431.7 (L) 09.836.0 - 11.946.0 %   MCV 83.0 78.0 - 100.0 fL   MCH 28.0 26.0 - 34.0 pg   MCHC 33.8 30.0 - 36.0 g/dL   RDW 14.714.4 82.911.5 - 56.215.5 %   Platelets 148 (L) 150 - 400 K/uL  [facility] and screen [facility]     Status: None   Collection Time: 09/15/15  6:45 AM  Result Value Ref Range   ABO/RH(D) A POS    Antibody Screen NEG    Sample Expiration 09/18/2015   Rapid HIV screen (HIV 1/2 Ab+Ag) ([facility] Only)     Status: None   Collection Time: 09/15/15  6:45 AM  Result Value Ref Range   HIV-1 P24 Antigen - HIV24 NON REACTIVE NON REACTIVE   HIV 1/2 Antibodies NON REACTIVE NON REACTIVE   Interpretation (HIV Ag Ab)      A non reactive test result means that HIV 1 or HIV 2 antibodies and HIV 1 p24 antigen were not detected in the specimen.    FHT: Category 1, 140 bpm, moderate variability, +accels, no decels UC:   regular, every 3-4 minutes, palpate mild/strong SVE:   Dilation: 6.5 Effacement (%): 90 Station: 0 Exam by:: Ernesteen Mihalic  Membranes: Intact, BBOW Internal monitors: None  Pain management:   IV pain medication:   IV fentanyl x2 doses,  Last at 0807  GBS prophylaxis: N/A   Assessment:  IUP at 39.6wks Active  Labor GBS Negative  Plan: - May have IV pain med/Epidural, PRN -Discussed R/B of amniotomy, Pt agreeable, may consider on next check, opted not to perform amniotomy in conjunction with IV pain medication  administration -Expectant management - anticipate SVD  Alphonzo Severanceachel Idrees Quam CNM, [place] 09/15/2015, 8:10 AM

## 2015-09-15 NOTE — Op Note (Signed)
Preoperative diagnosis: Intrauterine pregnancy at 39 weeks and 6 days, failure to progress  Post operative diagnosis: Same  Anesthesia: Epidural  Anesthesiologist: Dr. Leta JunglingEwell  Procedure: Primary low transverse cesarean section  Surgeon: Dr. Dois DavenportSandra Jaiveer Panas  Assistant: Gerrit HeckJessica Emly CNM  Estimated blood loss: 700 cc  Procedure:  After being informed of the planned procedure and possible complications including bleeding, infection, injury to other organs, informed consent is obtained. The patient is taken to OR #9 and given spinal anesthesia without complication. She is placed in the dorsal decubitus position with the pelvis tilted to the left. She is then prepped and draped in a sterile fashion. A Foley catheter is inserted in her bladder.  After assessing adequate level of anesthesia, we infiltrate the suprapubic area with 20 cc of Marcaine 0.25 and perform a Pfannenstiel incision which is brought down sharply to the fascia. The fascia is entered in a low transverse fashion. Linea alba is dissected. Peritoneum is entered in a midline fashion. An Alexis retractor is easily positioned.   The myometrium is then entered in a low transverse fashion, 2 cm above the vesico-uterine junction ; first with knife and then extended bluntly. Amniotic fluid is clear. We assist the birth of a female  infant in vertex presentation. transverse and complete right synclitism. Mouth and nose are suctioned. The baby is delivered. The cord is clamped and sectioned. The baby is given to the neonatologist present in the room.  10 cc of blood is drawn from the umbilical vein.The placenta is allowed to deliver spontaneously. It is complete and the cord has 3 vessels. Uterine revision is negative.  We proceed with closure of the myometrium in 2 layers: First with a running locked suture of 0 Vicryl, then with a Lembert suture of 0 Vicryl imbricating the first one.A round ligament tear with broad ligament extension are  repaired separately with 2 running sutures of 3-0 Vicryl .Hemostasis is completed with cauterization on peritoneal edges.  Both paracolic gutters are cleaned. Both tubes and ovaries are assessed and normal. The pelvis is profusely irrigated with warm saline to confirm a satisfactory hemostasis.  Retractors and sponges are removed. Under fascia hemostasis is completed with cauterization. The fascia is then closed with 2 running sutures of 0 Vicryl meeting midline. The wound is irrigated with warm saline and hemostasis is completed with cauterization. The skin is closed with a subcuticular suture of 3-0 Monocryl and Steri-Strips.  Instrument and sponge count is complete x2. Estimated blood loss is 700 cc.  The procedure is well tolerated by the patient who is taken to recovery room in a well and stable condition.  female baby named Shea Evansmery was born at 21:31. Apgars are pending.    Specimen: Placenta sent to [facility]   Khaliah Barnick A MD 4/23/20178:50 PM

## 2015-09-15 NOTE — Anesthesia Preprocedure Evaluation (Signed)
Anesthesia Evaluation  Patient identified by MRN, date of birth, ID band Patient awake    Reviewed: Allergy & Precautions, H&P , NPO status , Patient's Chart, lab work & pertinent test results  Airway Mallampati: II  TM Distance: >3 FB Neck ROM: full    Dental no notable dental hx. (+) Teeth Intact, Dental Advisory Given   Pulmonary neg pulmonary ROS,    Pulmonary exam normal breath sounds clear to auscultation       Cardiovascular Exercise Tolerance: Good negative cardio ROS   Rhythm:regular Rate:Normal     Neuro/Psych negative neurological ROS  negative psych ROS   GI/Hepatic negative GI ROS, Neg liver ROS,   Endo/Other  negative endocrine ROS  Renal/GU negative Renal ROS  negative genitourinary   Musculoskeletal   Abdominal   Peds  Hematology negative hematology ROS (+)   Anesthesia Other Findings   Reproductive/Obstetrics negative OB ROS (+) Pregnancy                             Anesthesia Physical Anesthesia Plan  ASA: II  Anesthesia Plan: Epidural   Post-op Pain Management:    Induction:   Airway Management Planned:   Additional Equipment:   Intra-op Plan:   Post-operative Plan:   Informed Consent: I have reviewed the patients History and Physical, chart, labs and discussed the procedure including the risks, benefits and alternatives for the proposed anesthesia with the patient or authorized representative who has indicated his/her understanding and acceptance.   Dental Advisory Given  Plan Discussed with: CRNA  Anesthesia Plan Comments:         Anesthesia Quick Evaluation

## 2015-09-15 NOTE — MAU Note (Signed)
[facility]. Will go to room 162

## 2015-09-16 ENCOUNTER — Encounter (HOSPITAL_COMMUNITY): Payer: Self-pay

## 2015-09-16 LAB — CBC
HCT: 29.2 % — ABNORMAL LOW (ref 36.0–46.0)
HEMOGLOBIN: 9.8 g/dL — AB (ref 12.0–15.0)
MCH: 27.9 pg (ref 26.0–34.0)
MCHC: 33.6 g/dL (ref 30.0–36.0)
MCV: 83.2 fL (ref 78.0–100.0)
Platelets: 131 10*3/uL — ABNORMAL LOW (ref 150–400)
RBC: 3.51 MIL/uL — AB (ref 3.87–5.11)
RDW: 14.5 % (ref 11.5–15.5)
WBC: 16.5 10*3/uL — AB (ref 4.0–10.5)

## 2015-09-16 MED ORDER — MENTHOL 3 MG MT LOZG: 1.0000 | LOZENGE | OROMUCOSAL | Status: DC | PRN

## 2015-09-16 MED ORDER — TETANUS-DIPHTH-ACELL PERTUSSIS 5-2.5-18.5 LF-MCG/0.5 IM SUSP: 0.5000 mL | Freq: Once | INTRAMUSCULAR | Status: DC

## 2015-09-16 MED ORDER — MEASLES, MUMPS & RUBELLA VAC ~~LOC~~ INJ
0.5000 mL | INJECTION | Freq: Once | SUBCUTANEOUS | Status: DC
  Filled 2015-09-16: qty 0.5

## 2015-09-16 MED ORDER — ZOLPIDEM TARTRATE 5 MG PO TABS: 5.0000 mg | ORAL_TABLET | Freq: Every evening | ORAL | Status: DC | PRN

## 2015-09-16 MED ORDER — WITCH HAZEL-GLYCERIN EX PADS: 1.0000 "application " | MEDICATED_PAD | CUTANEOUS | Status: DC | PRN

## 2015-09-16 MED ORDER — ACETAMINOPHEN 325 MG PO TABS
650.0000 mg | ORAL_TABLET | ORAL | Status: DC | PRN
  Administered 2015-09-16: 650 mg via ORAL
  Filled 2015-09-16: qty 2

## 2015-09-16 MED ORDER — SIMETHICONE 80 MG PO CHEW
80.0000 mg | CHEWABLE_TABLET | Freq: Three times a day (TID) | ORAL | Status: DC
  Administered 2015-09-16 – 2015-09-18 (×6): 80 mg via ORAL
  Filled 2015-09-16 (×7): qty 1

## 2015-09-16 MED ORDER — OXYTOCIN 10 UNIT/ML IJ SOLN: 2.5000 [IU]/h | INTRAVENOUS | Status: AC

## 2015-09-16 MED ORDER — OXYCODONE-ACETAMINOPHEN 5-325 MG PO TABS: 2.0000 | ORAL_TABLET | ORAL | Status: DC | PRN

## 2015-09-16 MED ORDER — OXYCODONE-ACETAMINOPHEN 5-325 MG PO TABS
1.0000 | ORAL_TABLET | ORAL | Status: DC | PRN
  Administered 2015-09-16 – 2015-09-18 (×9): 1 via ORAL
  Filled 2015-09-16 (×9): qty 1

## 2015-09-16 MED ORDER — FERROUS SULFATE 325 (65 FE) MG PO TABS
325.0000 mg | ORAL_TABLET | Freq: Two times a day (BID) | ORAL | Status: DC
  Administered 2015-09-16 – 2015-09-18 (×5): 325 mg via ORAL
  Filled 2015-09-16 (×5): qty 1

## 2015-09-16 MED ORDER — SIMETHICONE 80 MG PO CHEW: 80.0000 mg | CHEWABLE_TABLET | ORAL | Status: DC | PRN

## 2015-09-16 MED ORDER — DIBUCAINE 1 % RE OINT: 1.0000 "application " | TOPICAL_OINTMENT | RECTAL | Status: DC | PRN

## 2015-09-16 MED ORDER — DIPHENHYDRAMINE HCL 25 MG PO CAPS: 25.0000 mg | ORAL_CAPSULE | Freq: Four times a day (QID) | ORAL | Status: DC | PRN

## 2015-09-16 MED ORDER — COCONUT OIL OIL
1.0000 "application " | TOPICAL_OIL | Status: DC | PRN
  Administered 2015-09-17: 1 via TOPICAL
  Filled 2015-09-16: qty 120

## 2015-09-16 MED ORDER — SENNOSIDES-DOCUSATE SODIUM 8.6-50 MG PO TABS
2.0000 | ORAL_TABLET | ORAL | Status: DC
  Administered 2015-09-16: 2 via ORAL
  Filled 2015-09-16: qty 2

## 2015-09-16 MED ORDER — PRENATAL MULTIVITAMIN CH
1.0000 | ORAL_TABLET | Freq: Every day | ORAL | Status: DC
  Administered 2015-09-16 – 2015-09-17 (×2): 1 via ORAL
  Filled 2015-09-16 (×2): qty 1

## 2015-09-16 MED ORDER — LACTATED RINGERS IV SOLN
INTRAVENOUS | Status: DC
  Administered 2015-09-16: 07:00:00 via INTRAVENOUS

## 2015-09-16 MED ORDER — METHYLERGONOVINE MALEATE 0.2 MG/ML IJ SOLN: 0.2000 mg | INTRAMUSCULAR | Status: DC | PRN

## 2015-09-16 MED ORDER — IBUPROFEN 600 MG PO TABS
600.0000 mg | ORAL_TABLET | Freq: Four times a day (QID) | ORAL | Status: DC
  Administered 2015-09-16 – 2015-09-18 (×9): 600 mg via ORAL
  Filled 2015-09-16 (×9): qty 1

## 2015-09-16 MED ORDER — METHYLERGONOVINE MALEATE 0.2 MG PO TABS: 0.2000 mg | ORAL_TABLET | ORAL | Status: DC | PRN

## 2015-09-16 MED ORDER — SIMETHICONE 80 MG PO CHEW
80.0000 mg | CHEWABLE_TABLET | ORAL | Status: DC
  Administered 2015-09-16 – 2015-09-18 (×2): 80 mg via ORAL
  Filled 2015-09-16 (×2): qty 1

## 2015-09-16 NOTE — Lactation Note (Signed)
This note was copied from a baby's chart. Lactation Consultation Note: Lactation Brochure given with basic teaching done. Observed infant latched to the (R) breast. Staff nurse assist with latch and hand expression. Mother has harmony hand pump at the bedside. Staff nurse in room doing assessment. Will follow up again for more teaching. Mother receptive to teaching .  Patient Name: Cynthia [name] ZOXWR'UToday's Date: 09/16/2015 Reason for consult: Initial assessment   Maternal Data Formula Feeding for Exclusion:  (mother has decided to Br/Bo) Does the patient have breastfeeding experience prior to this delivery?: No  Feeding Feeding Type: Breast Fed Length of feed: 10 min  LATCH Score/Interventions Latch: Repeated attempts needed to sustain latch, nipple held in mouth throughout feeding, stimulation needed to elicit sucking reflex. Intervention(s): Adjust position;Assist with latch;Breast massage  Audible Swallowing: A few with stimulation Intervention(s): Skin to skin;Hand expression  Type of Nipple: Everted at rest and after stimulation  Comfort (Breast/Nipple): Soft / non-tender     Hold (Positioning): Assistance needed to correctly position infant at breast and maintain latch. Intervention(s): Breastfeeding basics reviewed;Support Pillows;Position options;Skin to skin  LATCH Score: 7  Lactation Tools Discussed/Used     Consult Status Consult Status: Follow-up Date: 09/16/15 Follow-up type: In-patient    Stevan BornKendrick, Cele M[facility] 09/16/2015, 12:16 PM

## 2015-09-16 NOTE — Progress Notes (Signed)
Patient's pulse has been elevated 99-116 this 12 hour shift. Patient is asymptomatic, denies feeling bad, tolerated ambulating to bathroom with assist well and bleeding WNL.

## 2015-09-16 NOTE — Anesthesia Postprocedure Evaluation (Signed)
Anesthesia Post Note  Patient: Cynthia ConroyLatashra K Kichline  Procedure(s) Performed: Procedure(s) (LRB): CESAREAN SECTION (N/A)  Patient location during evaluation: Mother Baby Anesthesia Type: Epidural Level of consciousness: awake Pain management: satisfactory to patient Vital Signs Assessment: post-procedure vital signs reviewed and stable Respiratory status: spontaneous breathing Cardiovascular status: stable Anesthetic complications: no    Last Vitals:  Filed Vitals:   09/16/15 0330 09/16/15 0444  BP: 118/71   Pulse: 120   Temp: 36.9 C 36.9 C  Resp: 18 20    Last Pain:  Filed Vitals:   09/16/15 0509  PainSc: 6                  Zera Markwardt

## 2015-09-16 NOTE — Progress Notes (Signed)
UR chart review completed.  

## 2015-09-16 NOTE — Addendum Note (Signed)
Addendum  created 09/16/15 0746 by Algis GreenhouseLinda A Aryahi Denzler, CRNA   Modules edited: Clinical Notes   Clinical Notes:  File: [id number]

## 2015-09-16 NOTE — Lactation Note (Signed)
This note was copied from a baby's chart. Lactation Consultation Note  Mom is concerned because baby is not feeding every 3 hours. Explained that she needed to feed the baby on cue.  Attempted to feed the baby but she fell asleep once she was at the breast. Reviewed hand expression and feeding cues. Encouragement given.  Patient Name: Girl Harold BarbanLatashra [place] ZOXWR'UToday's Date: 09/16/2015 Reason for consult: Follow-up assessment   Maternal Data    Feeding Feeding Type: Breast Fed Length of feed: 0 min  LATCH Score/Interventions Latch: Too sleepy or reluctant, no latch achieved, no sucking elicited.  Audible Swallowing: None Intervention(s): Skin to skin  Type of Nipple: Everted at rest and after stimulation  Comfort (Breast/Nipple): Soft / non-tender     Hold (Positioning): Assistance needed to correctly position infant at breast and maintain latch. Intervention(s): Breastfeeding basics reviewed;Support Pillows  LATCH Score: 5  Lactation Tools Discussed/Used     Consult Status      Soyla DryerJoseph, Jeramie Scogin 09/16/2015, 5:46 PM

## 2015-09-16 NOTE — Progress Notes (Signed)
Post Partum Day 1 s/p Cesarean section  Subjective: no complaints, up ad lib, tolerating PO and + flatus...   Objective: Blood pressure 99/61, pulse 121, temperature 98.3 F (36.8 C), temperature source Oral, resp. rate 24, height 5\' 3"  (1.6 m), weight 153 lb (69.4 kg), last menstrual period 12/10/2014, SpO2 99 %, unknown if currently breastfeeding.  Physical Exam:  General: alert and cooperative Lochia: appropriate Uterine Fundus: firm Incision: healing well DVT Evaluation: No evidence of DVT seen on physical exam.   Recent Labs  09/15/15 1439 09/16/15 0511  HGB 9.6* 9.8*  HCT 28.1* 29.2*    Assessment/Plan: Breastfeeding and Lactation consult  Routine postpartum care.  Pain well controlled Encouraged ambulation    LOS: 1 day   Prateek Knipple J. 09/16/2015, 7:53 PM

## 2015-09-17 LAB — BIRTH TISSUE RECOVERY COLLECTION (PLACENTA DONATION)

## 2015-09-17 MED ORDER — DOCUSATE SODIUM 100 MG PO CAPS
100.0000 mg | ORAL_CAPSULE | Freq: Two times a day (BID) | ORAL | Status: DC
  Administered 2015-09-17: 100 mg via ORAL
  Filled 2015-09-17: qty 1

## 2015-09-17 NOTE — Lactation Note (Signed)
This note was copied from a baby's chart. Lactation Consultation Note  Patient Name: Cynthia Harold BarbanLatashra Mcmullen NWGNF'AToday's Date: 09/17/2015 Reason for consult: Follow-up assessment Baby at 48 hr of life and mom is worried about supply. Discussed baby behavior, feeding frequency, baby belly size, voids, wt loss, breast changes, and nipple care. Mom stated that she can manually express, has seen colostrum bilaterally, and has a spoon in the room. She is aware of lactation services and support group. Encouraged her to keep putting abby to breast on demand and f/u with expressed milk if needed.      Maternal Data    Feeding    LATCH Score/Interventions                      Lactation Tools Discussed/Used     Consult Status Consult Status: Follow-up Date: 09/18/15 Follow-up type: In-patient    Rulon Eisenmengerlizabeth E Adaysha Dubinsky 09/17/2015, 10:21 PM

## 2015-09-17 NOTE — Progress Notes (Signed)
Post Partum Day 2 cesarean section for failure to progress  Subjective: no complaints, up ad lib, voiding, tolerating PO and + flatus  Objective: Blood pressure 122/67, pulse 111, temperature 98.2 F (36.8 C), temperature source Oral, resp. rate 18, height 5\' 3"  (1.6 m), weight 153 lb (69.4 kg), last menstrual period 12/10/2014, SpO2 99 %, unknown if currently breastfeeding.  Physical Exam:  General: alert and cooperative Lochia: appropriate Uterine Fundus: firm Incision: healing well DVT Evaluation: No evidence of DVT seen on physical exam.   Recent Labs  09/15/15 1439 09/16/15 0511  HGB 9.6* 9.8*  HCT 28.1* 29.2*    Assessment/Plan: Plan for discharge tomorrow, Breastfeeding and Lactation consult  Routine postpartum care    LOS: 2 days   Cynthia [name] J. 09/17/2015, 1:46 PM

## 2015-09-17 NOTE — Lactation Note (Signed)
This note was copied from a baby's chart. Lactation Consultation Note  Patient Name: Cynthia [name] Cynthia [name]: 09/17/2015 Reason for consult: Follow-up assessment Baby at 43 hr of life. Mom was taking a bath and requested that lactation come back later.   Maternal Data    Feeding Feeding Type: Breast Fed  LATCH Score/Interventions Latch: Grasps breast easily, tongue down, lips flanged, rhythmical sucking.  Audible Swallowing: A few with stimulation Intervention(s): Hand expression Intervention(s): Alternate breast massage  Type of Nipple: Everted at rest and after stimulation  Comfort (Breast/Nipple): Soft / non-tender     Hold (Positioning): Assistance needed to correctly position infant at breast and maintain latch. (breast filling)  LATCH Score: 8  Lactation Tools Discussed/Used WIC Program: Yes   Consult Status Consult Status: Follow-up [name]: 09/17/15 Follow-up type: In-patient    Rulon Eisenmengerlizabeth E Sativa Gelles 09/17/2015, 5:26 PM

## 2015-09-18 MED ORDER — FERROUS SULFATE 325 (65 FE) MG PO TABS: 325.0000 mg | ORAL_TABLET | Freq: Every day | ORAL | Status: DC

## 2015-09-18 MED ORDER — IBUPROFEN 600 MG PO TABS
600.0000 mg | ORAL_TABLET | Freq: Four times a day (QID) | ORAL | Status: AC | PRN
Start: 2015-09-18 — End: ?

## 2015-09-18 MED ORDER — OXYCODONE-ACETAMINOPHEN 5-325 MG PO TABS: 1.0000 | ORAL_TABLET | Freq: Four times a day (QID) | ORAL | Status: AC | PRN

## 2015-09-18 NOTE — Discharge Summary (Signed)
OB Discharge Summary     Patient Name: Cynthia [name] DOB: March 20, 1991 MRN: [id number]  Date of admission: 09/15/2015 Delivering MD: Silverio Lay   Date of discharge: 09/18/2015  Admitting diagnosis: 39wks, contractions Intrauterine pregnancy: [redacted]w[redacted]d     Secondary diagnosis:  Active Problems:   Indication for care in labor or delivery   Cesarean delivery delivered  Additional problems: None     Discharge diagnosis: Term Pregnancy Delivered                                                                                                Post partum procedures:NONE  Augmentation: Pitocin  Complications: None  Hospital course:  Onset of Labor With Unplanned C/S  25 y.o. yo G1P1001 at [redacted]w[redacted]d was admitted in Active Labor on 09/15/2015. Patient had a labor course significant for failure to progress . Membrane Rupture Time/Date: 11:42 AM ,09/15/2015   The patient went for cesarean section due to Arrest of Dilation, and delivered a Viable infant,09/15/2015  Details of operation can be found in separate operative note. Patient had an uncomplicated postpartum course.  She is ambulating,tolerating a regular diet, passing flatus, and urinating well.  Patient is discharged home in stable condition 09/18/2015.  Physical exam  Filed Vitals:   09/16/15 2100 09/17/15 0514 09/17/15 1806 09/18/15 0557  BP: 125/65 122/67 133/72 131/82  Pulse: 107 111 119   Temp: 98.2 F (36.8 C) 98.2 F (36.8 C) 98.5 F (36.9 C) 98.5 F (36.9 C)  TempSrc: Oral Oral Oral Oral  Resp: Height:      Weight:      SpO2: 99%      General: alert, cooperative and no distress Lochia: appropriate Uterine Fundus: firm Incision: Healing well with no significant drainage DVT Evaluation: No evidence of DVT seen on physical exam. Labs: Lab Results  Component Value Date   WBC 16.5* 09/16/2015   HGB 9.8* 09/16/2015   HCT 29.2* 09/16/2015   MCV 83.2 09/16/2015   PLT 131* 09/16/2015   CMP Latest Ref Rng  09/15/2015  Glucose 65 - 99 mg/dL 409(W)  BUN 6 - 20 mg/dL 10  Creatinine 1.19 - 1.47 mg/dL 8.29  Sodium 562 - 130 mmol/L 127(L)  Potassium 3.5 - 5.1 mmol/L 3.6  Chloride 101 - 111 mmol/L 101  CO2 22 - 32 mmol/L 20(L)  Calcium 8.9 - 10.3 mg/dL 8.1(L)  Total Protein 6.5 - 8.1 g/dL 8.6(V)  Total Bilirubin 0.3 - 1.2 mg/dL 0.3  Alkaline Phos 38 - 126 U/L 133(H)  AST 15 - 41 U/L 33  ALT 14 - 54 U/L 16    Discharge instruction: per After Visit Summary and "Baby and Me Booklet".  After visit meds:    Medication List    TAKE these medications        ibuprofen 600 MG tablet  Commonly known as:  ADVIL,MOTRIN  Take 1 tablet (600 mg total) by mouth every 6 (six) hours as needed.     INTEGRA 62.5-62.5-40-3 MG Caps  Take 2 capsules by mouth daily.     loratadine 10  MG tablet  Commonly known as:  CLARITIN  Take 10 mg by mouth daily.     multivitamin-prenatal 27-0.8 MG Tabs tablet  Take 1 tablet by mouth daily.     oxyCODONE-acetaminophen 5-325 MG tablet  Commonly known as:  PERCOCET/ROXICET  Take 1-2 tablets by mouth every 6 (six) hours as needed (pain scale 4-7).        Diet: routine diet  Activity: Advance as tolerated. Pelvic rest for 6 weeks.   Outpatient follow up:2 weeks Follow up Appt:No future appointments. Follow up Visit:No Follow-up on file.  Postpartum contraception: Not Discussed  Newborn Data: Live born female  Birth Weight: 7 lb 0.2 oz (3180 g) APGAR: 8, 9  Baby Feeding: Breast Disposition:home with mother   09/18/2015 Jessee AversOLE,Jakyrie Totherow J., MD

## 2015-09-18 NOTE — Lactation Note (Signed)
This note was copied from a baby's chart. Lactation Consultation Note  Mother's breasts are filling.  Discussed breastfeeding on demand to empty breast and establish milk supply. Suggest breastfeeding before offering formula. Mother states during the night she only formula fed.  Discussed how that could impact her supply. Reviewed engorgement care and monitoring voids/stools. Mother has hand pump.  Mom encouraged to feed baby 8-12 times/24 hours and with feeding cues.    Patient Name: Cynthia [name] BarbanLatashra Fiorito NGEXB'MToday's Date: 09/18/2015 Reason for consult: Follow-up assessment   Maternal Data    Feeding Feeding Type: Breast Fed Length of feed: 15 min  LATCH Score/Interventions                      Lactation Tools Discussed/Used     Consult Status Consult Status: Complete    Hardie PulleyBerkelhammer, Ruth Boschen 09/18/2015, 10:50 AM

## 2015-09-18 NOTE — Discharge Instructions (Signed)
Cesarean Delivery Cesarean delivery is the birth of a baby through a cut (incision) in the abdomen and womb (uterus).  LET YOUR HEALTH CARE PROVIDER KNOW ABOUT:  All medicines you are taking, including vitamins, herbs, eye drops, creams, and over-the-counter medicines.  Previous problems you or members of your family have had with the use of anesthetics.  Any bleeding or blood clotting disorders you have.  Family history of blood clots or bleeding disorders.  Any history of deep vein thrombosis (DVT) or pulmonary embolism (PE).  Previous surgeries you have had.  Medical conditions you have.  Any allergies you have.  Complicationsinvolving the pregnancy. RISKS AND COMPLICATIONS  Generally, this is a safe procedure. However, as with any procedure, complications can occur. Possible complications include:  Bleeding.  Infection.  Blood clots.  Injury to surrounding organs.  Problems with anesthesia.  Injury to the baby. BEFORE THE PROCEDURE   You may be given an antacid medicine to drink. This will prevent acid contents in your stomach from going into your lungs if you vomit during the surgery.  You may be given an antibiotic medicine to prevent infection. PROCEDURE   To prevent infection of your incision:  Hair may be removed from your pubic area if it is near your incision.  The skin of your pubic area and lower abdomen will be cleaned with a germ-killing solution (antiseptic).  A tube (Foley catheter) will be placed in your bladder to drain your urine from your bladder into a bag. This keeps your bladder empty during surgery.  An IV tube will be placed in your vein.  You may be given medicine to numb the lower half of your body (regional anesthetic). If you were in labor, you may have already had an epidural in place which can be used in both labor and cesarean delivery. You may possibly be given medicine to make you sleep (general anesthetic) though this is not as  common.  Your heart rate and your baby's heart rate will be monitored.  An incision will be made in your abdomen that extends to your uterus. There are 2 basic kinds of incisions:  The horizontal (transverse) incision. Horizontal incisions are from side to side and are used for most routine cesarean deliveries.  The vertical incision. The vertical incision is from the top of the abdomen to the bottom and is less commonly used. It is often done for women who have a serious complication (extreme prematurity) or under emergency situations.  The horizontal and vertical incisions may both be used at the same time. However, this is very uncommon.  An incision is then made in your uterus to deliver the baby.  Your baby will be delivered.  Your health care provider may place the baby on your chest. It is important to keep the baby warm. Your health care provider will dry off the baby, place the baby directly on your bare skin, and cover the baby with warm, dry blankets.  Both incisions will be closed with absorbable stitches. AFTER THE PROCEDURE   If you were awake during the surgery, you will see your baby right away. If you were asleep, you will see your baby as soon as you are awake.  You may breastfeed your baby after surgery.  You may be able to get up and walk the same day as the surgery. If you need to stay in bed for a period of time, you will receive help to turn, cough, and take deep breaths after   surgery. This helps prevent lung problems such as pneumonia.  Do not get out of bed alone the first time after surgery. You will need help getting out of bed until you are able to do this by yourself.  You may be able to shower the day after your cesarean delivery. After the bandage (dressing) is taken off the incision site, a nurse will assist you to shower if you would like help.  You may be directed to take actions to help prevent blood clots in your legs. These may  include:  Walking shortly after surgery, with someone assisting you. Moving around after surgery helps to improve blood flow.  Wearing compression stockings or using different types of devices.  Taking medicines to thin your blood (anticoagulants) if you are at high risk for DVT or PE.  Save any blood clots that you pass from your vagina. If you pass a clot while on the toilet, do not flush it. Call for the nurse. Tell the nurse if you think you are bleeding too much or passing too many clots.  You will be given medicine for pain and nausea as needed. Let your health care providers know if you are hurting. You may also be given an antibiotic to prevent an infection.  Your IV tube will be taken out when you are drinking a reasonable amount of fluids. The Foley catheter is taken out when you are up and walking.  If your blood type is Rh negative and your baby's blood type is Rh positive, you will be given a shot of anti-D immune globulin. This shot prevents you from having Rh problems with a future pregnancy. You should get the shot even if you had your tubes tied (tubal ligation).  If you are allowed to take the baby for a walk, place the baby in the bassinet and push it.   This information is not intended to replace advice given to you by your health care provider. Make sure you discuss any questions you have with your health care provider.   Document Released: 05/11/2005 Document Revised: 01/30/2015 Document Reviewed: 01/06/2012 [employer] Interactive Patient Education 2016 [employer]  

## 2015-09-20 ENCOUNTER — Inpatient Hospital Stay (HOSPITAL_COMMUNITY): Admission: RE | Admit: 2015-09-20 | Payer: 59 | Source: Ambulatory Visit
